# Patient Record
Sex: Female | Born: 1955 | Race: White | Hispanic: No | Marital: Married | Smoking: Never smoker
Health system: Southern US, Community
[De-identification: ages and names within clinical notes are randomized; demographics above are authoritative.]

## PROBLEM LIST (undated history)

## (undated) DIAGNOSIS — N951 Menopausal and female climacteric states: Secondary | ICD-10-CM

## (undated) DIAGNOSIS — M810 Age-related osteoporosis without current pathological fracture: Secondary | ICD-10-CM

## (undated) DIAGNOSIS — C439 Malignant melanoma of skin, unspecified: Secondary | ICD-10-CM

## (undated) DIAGNOSIS — C44612 Basal cell carcinoma of skin of right upper limb, including shoulder: Secondary | ICD-10-CM

## (undated) DIAGNOSIS — C801 Malignant (primary) neoplasm, unspecified: Secondary | ICD-10-CM

## (undated) DIAGNOSIS — G43909 Migraine, unspecified, not intractable, without status migrainosus: Secondary | ICD-10-CM

## (undated) DIAGNOSIS — J45909 Unspecified asthma, uncomplicated: Secondary | ICD-10-CM

## (undated) DIAGNOSIS — T7840XA Allergy, unspecified, initial encounter: Secondary | ICD-10-CM

## (undated) DIAGNOSIS — R112 Nausea with vomiting, unspecified: Secondary | ICD-10-CM

## (undated) DIAGNOSIS — D045 Carcinoma in situ of skin of trunk: Secondary | ICD-10-CM

## (undated) DIAGNOSIS — Z9889 Other specified postprocedural states: Secondary | ICD-10-CM

## (undated) DIAGNOSIS — F419 Anxiety disorder, unspecified: Secondary | ICD-10-CM

## (undated) DIAGNOSIS — K5790 Diverticulosis of intestine, part unspecified, without perforation or abscess without bleeding: Secondary | ICD-10-CM

## (undated) HISTORY — DX: Nausea with vomiting, unspecified: R11.2

## (undated) HISTORY — DX: Unspecified asthma, uncomplicated: J45.909

## (undated) HISTORY — DX: Malignant melanoma of skin, unspecified: C43.9

## (undated) HISTORY — DX: Basal cell carcinoma of skin of right upper limb, including shoulder: C44.612

## (undated) HISTORY — DX: Diverticulosis of intestine, part unspecified, without perforation or abscess without bleeding: K57.90

## (undated) HISTORY — PX: COSMETIC SURGERY: SHX468

## (undated) HISTORY — DX: Other specified postprocedural states: Z98.890

## (undated) HISTORY — DX: Age-related osteoporosis without current pathological fracture: M81.0

## (undated) HISTORY — DX: Anxiety disorder, unspecified: F41.9

## (undated) HISTORY — DX: Allergy, unspecified, initial encounter: T78.40XA

## (undated) HISTORY — DX: Carcinoma in situ of skin of trunk: D04.5

## (undated) HISTORY — DX: Malignant (primary) neoplasm, unspecified: C80.1

## (undated) HISTORY — PX: COLONOSCOPY: SHX174

## (undated) HISTORY — DX: Menopausal and female climacteric states: N95.1

## (undated) HISTORY — PX: OTHER SURGICAL HISTORY: SHX169

---

## 1997-11-12 ENCOUNTER — Other Ambulatory Visit: Admission: RE | Admit: 1997-11-12 | Discharge: 1997-11-12 | Payer: Self-pay | Admitting: Obstetrics and Gynecology

## 1998-11-18 ENCOUNTER — Other Ambulatory Visit: Admission: RE | Admit: 1998-11-18 | Discharge: 1998-11-18 | Payer: Self-pay | Admitting: Obstetrics and Gynecology

## 1999-11-19 ENCOUNTER — Other Ambulatory Visit: Admission: RE | Admit: 1999-11-19 | Discharge: 1999-11-19 | Payer: Self-pay | Admitting: *Deleted

## 2000-12-21 ENCOUNTER — Other Ambulatory Visit: Admission: RE | Admit: 2000-12-21 | Discharge: 2000-12-21 | Payer: Self-pay | Admitting: Obstetrics and Gynecology

## 2002-02-01 ENCOUNTER — Other Ambulatory Visit: Admission: RE | Admit: 2002-02-01 | Discharge: 2002-02-01 | Payer: Self-pay | Admitting: Obstetrics and Gynecology

## 2003-02-16 ENCOUNTER — Other Ambulatory Visit: Admission: RE | Admit: 2003-02-16 | Discharge: 2003-02-16 | Payer: Self-pay | Admitting: Obstetrics and Gynecology

## 2003-06-26 ENCOUNTER — Encounter: Admission: RE | Admit: 2003-06-26 | Discharge: 2003-06-26 | Payer: Self-pay | Admitting: Internal Medicine

## 2004-03-17 ENCOUNTER — Encounter (INDEPENDENT_AMBULATORY_CARE_PROVIDER_SITE_OTHER): Payer: Self-pay | Admitting: Specialist

## 2004-03-17 ENCOUNTER — Ambulatory Visit (HOSPITAL_COMMUNITY): Admission: RE | Admit: 2004-03-17 | Discharge: 2004-03-17 | Payer: Self-pay | Admitting: Obstetrics and Gynecology

## 2004-07-11 ENCOUNTER — Ambulatory Visit: Payer: Self-pay | Admitting: Internal Medicine

## 2004-07-30 ENCOUNTER — Ambulatory Visit: Payer: Self-pay | Admitting: Internal Medicine

## 2004-09-10 ENCOUNTER — Ambulatory Visit: Payer: Self-pay | Admitting: Gastroenterology

## 2004-09-24 ENCOUNTER — Ambulatory Visit: Payer: Self-pay | Admitting: Gastroenterology

## 2005-09-14 ENCOUNTER — Ambulatory Visit: Payer: Self-pay | Admitting: Internal Medicine

## 2005-09-17 ENCOUNTER — Ambulatory Visit: Payer: Self-pay | Admitting: Internal Medicine

## 2005-10-25 IMAGING — US US RETROPERITONEAL COMPLETE
1 series · 14 of 25 positions shown · non-contrast
Comparison: none

CLINICAL DATA: Right flank pain.  Hematuria.
 RENAL ULTRASOUND

[Series 1: unknown · 0.23mm/px · 14 of 25 slices shown]
[im 1/25]
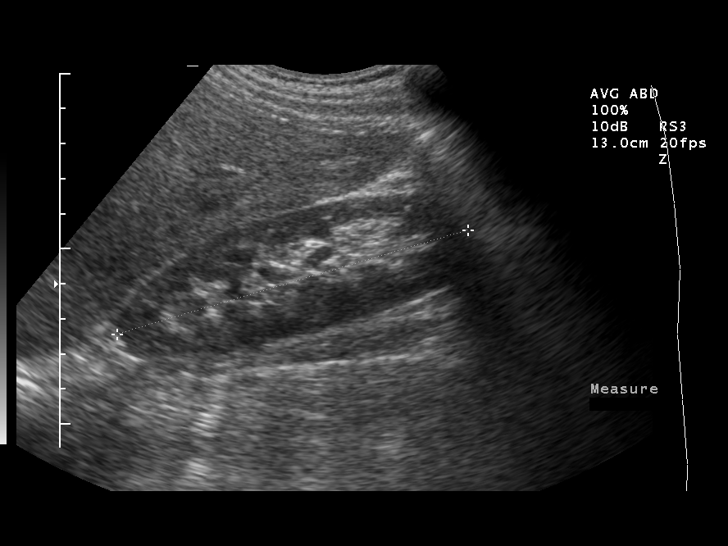
[im 3/25]
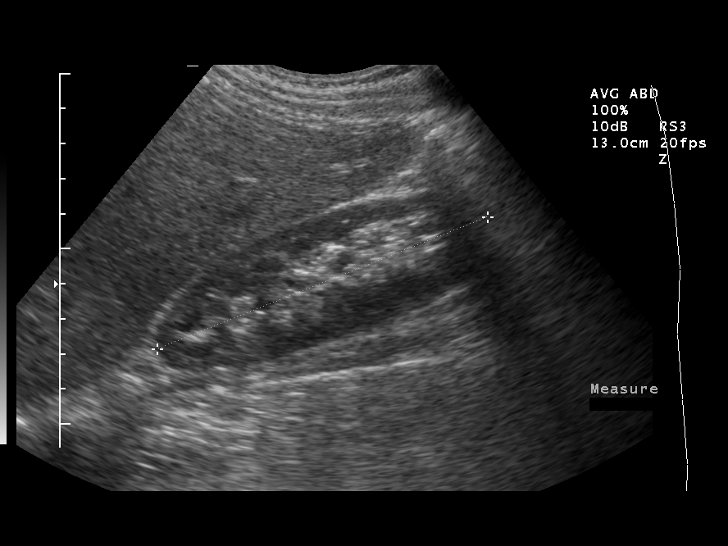
[im 5/25]
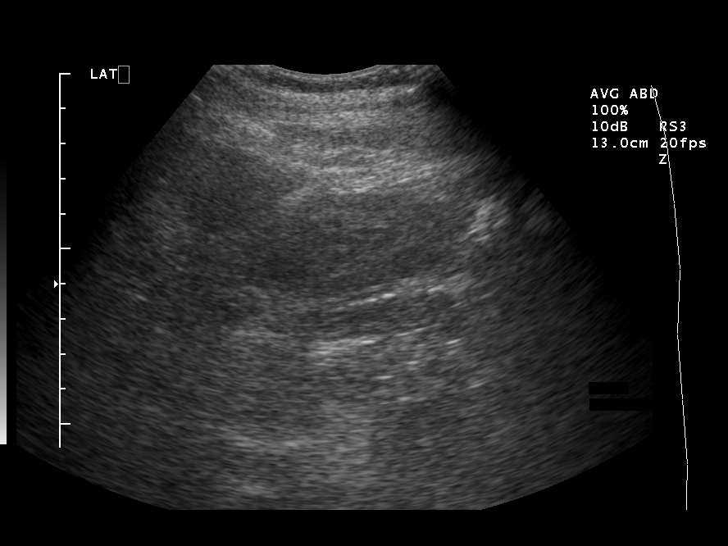
[im 7/25]
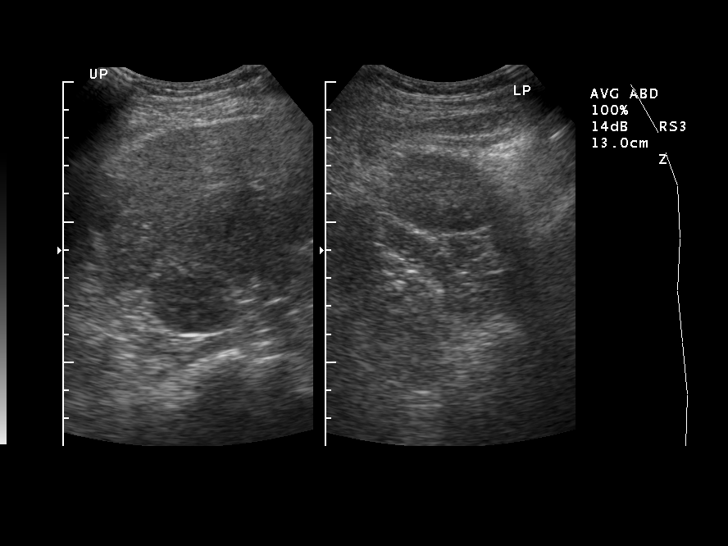
[im 9/25]
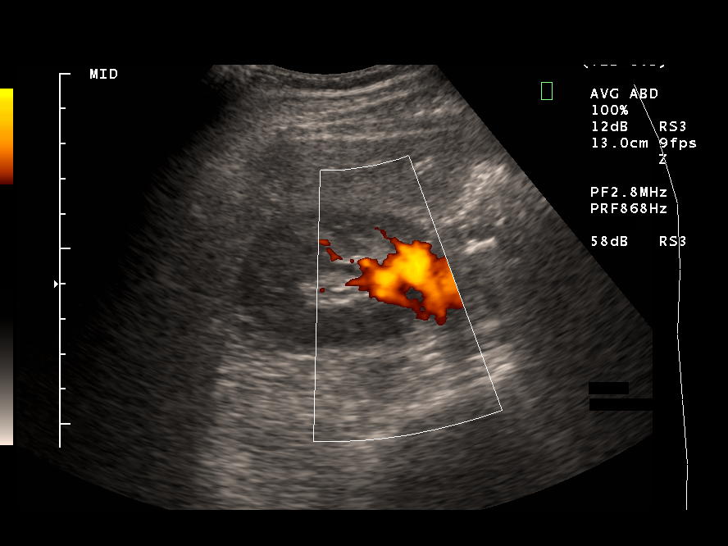
[im 10/25]
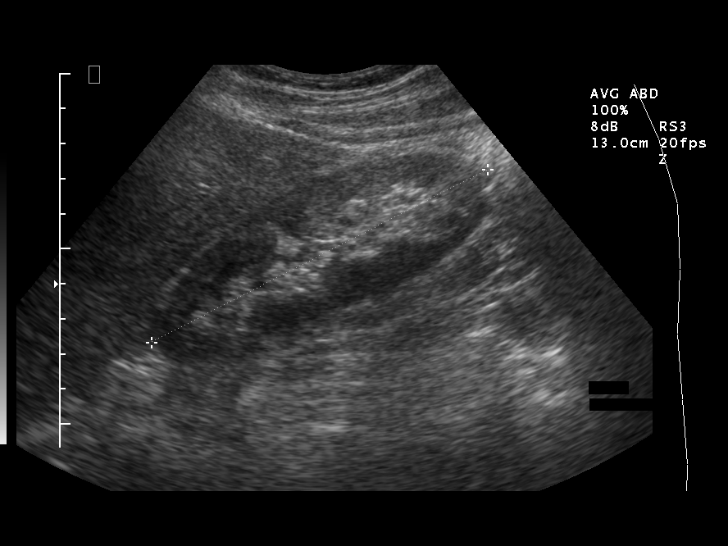
[im 12/25]
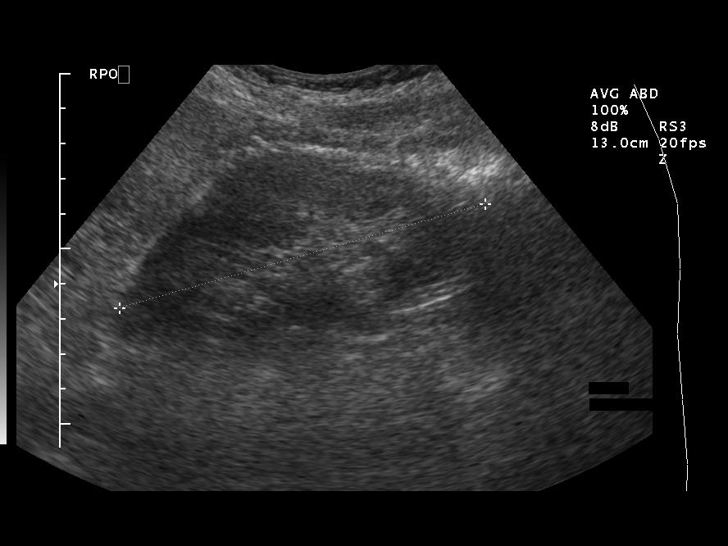
[im 14/25]
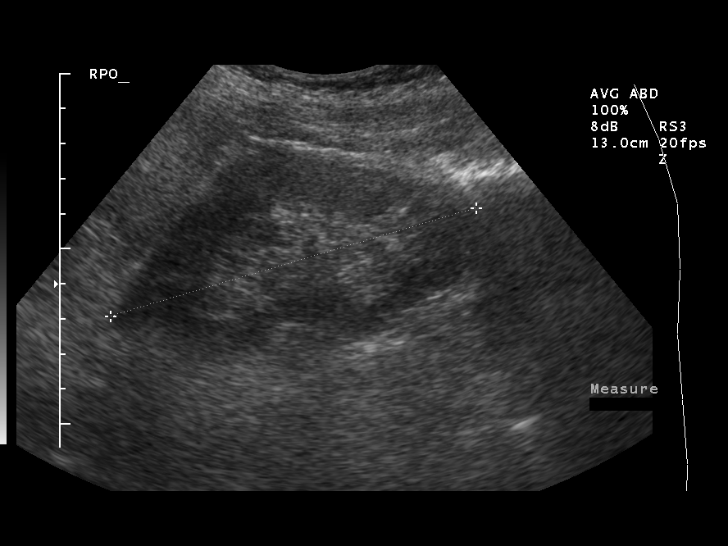
[im 16/25]
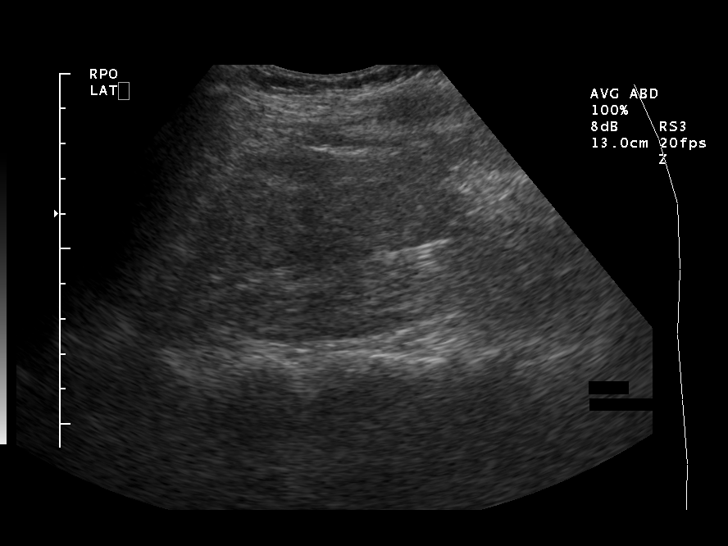
[im 17/25]
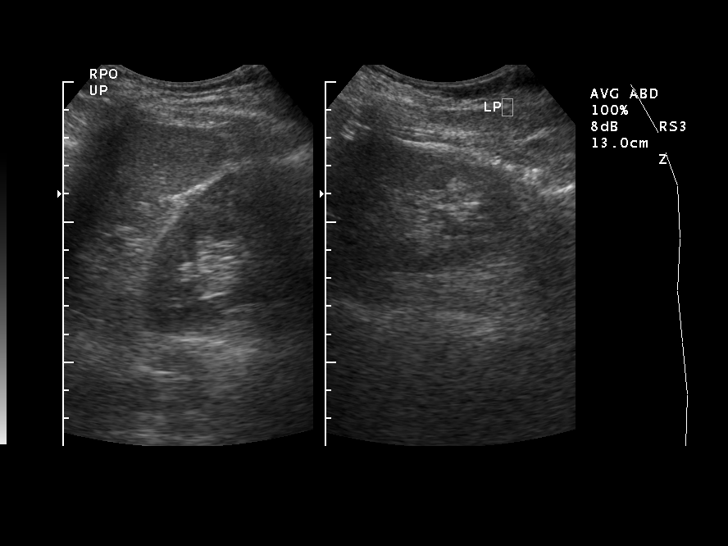
[im 19/25]
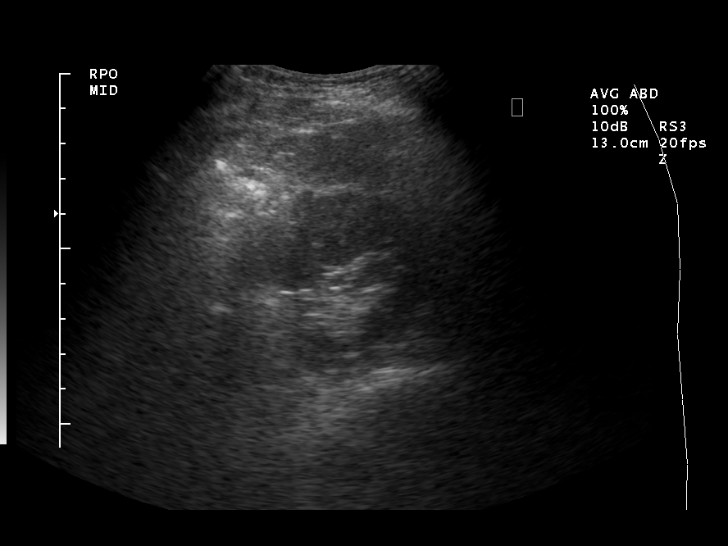
[im 21/25]
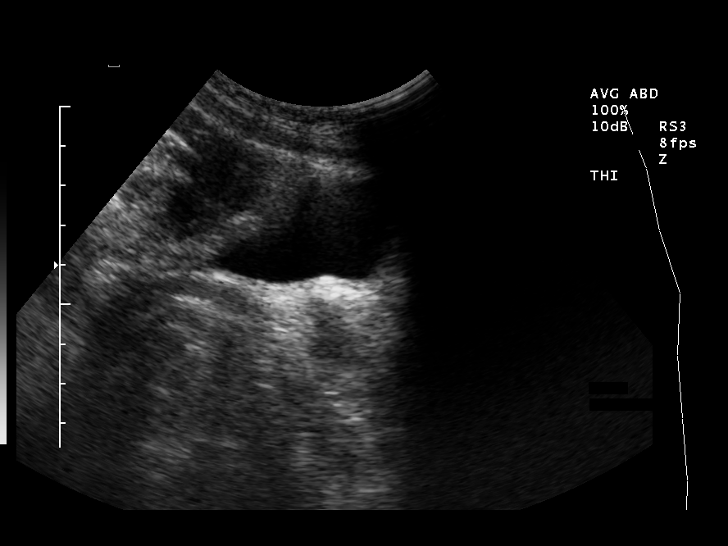
[im 23/25]
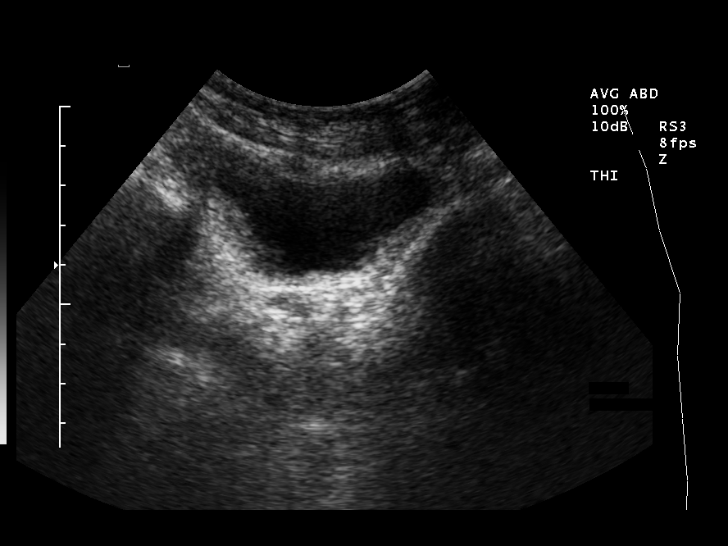
[im 25/25]
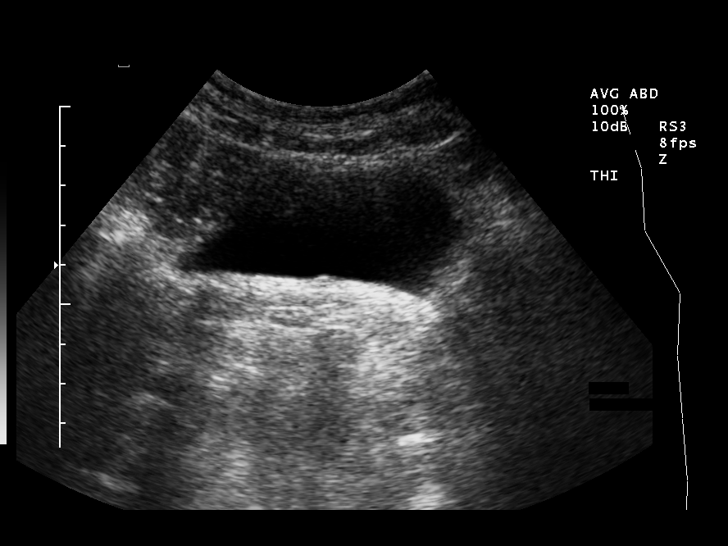

[14 of 25 positions shown; findings below may reference images not displayed]

FINDINGS: Both kidneys are within normal limits in size and parenchymal echogenicity. There is no evidence of renal parenchymal lesions.  The right kidney measures 10.8 cm long with the left kidney measuring 10.9 cm long with no hydronephrosis.

 The urinary bladder is unremarkable in appearance for the degree of bladder filling. 

 IMPRESSION
 Normal study.

## 2007-04-20 LAB — CONVERTED CEMR LAB: Pap Smear: NORMAL

## 2007-04-26 ENCOUNTER — Encounter: Payer: Self-pay | Admitting: Internal Medicine

## 2007-05-04 ENCOUNTER — Encounter: Payer: Self-pay | Admitting: Internal Medicine

## 2007-06-29 ENCOUNTER — Ambulatory Visit: Payer: Self-pay | Admitting: Internal Medicine

## 2007-06-29 LAB — CONVERTED CEMR LAB
ALT: 21 units/L (ref 0–35)
AST: 25 units/L (ref 0–37)
Basophils Relative: 0.3 % (ref 0.0–1.0)
Bilirubin, Direct: 0.1 mg/dL (ref 0.0–0.3)
CO2: 29 meq/L (ref 19–32)
Calcium: 9.5 mg/dL (ref 8.4–10.5)
Eosinophils Relative: 3 % (ref 0.0–5.0)
GFR calc Af Amer: 84 mL/min
Glucose, Bld: 92 mg/dL (ref 70–99)
HCT: 40.4 % (ref 36.0–46.0)
Hemoglobin: 14 g/dL (ref 12.0–15.0)
Leukocytes, UA: NEGATIVE
Lymphocytes Relative: 40.6 % (ref 12.0–46.0)
Neutro Abs: 1.9 10*3/uL (ref 1.4–7.7)
Neutrophils Relative %: 45.5 % (ref 43.0–77.0)
Nitrite: NEGATIVE
Platelets: 189 10*3/uL (ref 150–400)
Sodium: 141 meq/L (ref 135–145)
Specific Gravity, Urine: 1.015 (ref 1.000–1.03)
Total Protein, Urine: NEGATIVE mg/dL
Total Protein: 6.8 g/dL (ref 6.0–8.3)
Urobilinogen, UA: 0.2 (ref 0.0–1.0)
WBC: 4.1 10*3/uL — ABNORMAL LOW (ref 4.5–10.5)

## 2007-07-01 ENCOUNTER — Encounter: Payer: Self-pay | Admitting: *Deleted

## 2007-07-01 DIAGNOSIS — J45909 Unspecified asthma, uncomplicated: Secondary | ICD-10-CM | POA: Insufficient documentation

## 2007-07-01 DIAGNOSIS — G43909 Migraine, unspecified, not intractable, without status migrainosus: Secondary | ICD-10-CM | POA: Insufficient documentation

## 2007-07-01 DIAGNOSIS — H15009 Unspecified scleritis, unspecified eye: Secondary | ICD-10-CM | POA: Insufficient documentation

## 2007-07-01 DIAGNOSIS — M810 Age-related osteoporosis without current pathological fracture: Secondary | ICD-10-CM | POA: Insufficient documentation

## 2007-07-01 DIAGNOSIS — J189 Pneumonia, unspecified organism: Secondary | ICD-10-CM | POA: Insufficient documentation

## 2007-07-01 DIAGNOSIS — B351 Tinea unguium: Secondary | ICD-10-CM | POA: Insufficient documentation

## 2007-07-01 DIAGNOSIS — M722 Plantar fascial fibromatosis: Secondary | ICD-10-CM | POA: Insufficient documentation

## 2007-07-04 ENCOUNTER — Ambulatory Visit: Payer: Self-pay | Admitting: Internal Medicine

## 2008-01-31 ENCOUNTER — Encounter: Payer: Self-pay | Admitting: Internal Medicine

## 2008-02-03 ENCOUNTER — Telehealth: Payer: Self-pay | Admitting: Internal Medicine

## 2008-11-29 ENCOUNTER — Encounter: Payer: Self-pay | Admitting: Internal Medicine

## 2009-04-29 ENCOUNTER — Encounter: Payer: Self-pay | Admitting: Internal Medicine

## 2009-12-25 ENCOUNTER — Telehealth: Payer: Self-pay | Admitting: Internal Medicine

## 2010-07-08 NOTE — Progress Notes (Signed)
    Preventive Care Screening  Mammogram:    Date:  12/04/2009    Results:  normal bilateral

## 2010-10-24 NOTE — Op Note (Signed)
NAME:  Laurie Johns, Laurie Johns               ACCOUNT NO.:  [id number]   MEDICAL RECORD NO.:  [id number]          PATIENT TYPE:  AMB   LOCATION:  [facility]                           FACILITY:  [facility]   PHYSICIAN:  Lenoard Aden, M.D.DATE OF BIRTH:  1955/07/24   DATE OF PROCEDURE:  03/17/2004  DATE OF DISCHARGE:                                 OPERATIVE REPORT   PREOPERATIVE DIAGNOSES:  1.  Postmenopausal bleeding.  2.  Endometrial polyp.   POSTOPERATIVE DIAGNOSES:  1.  Postmenopausal bleeding.  2.  Endometrial polyp.   PROCEDURE:  Diagnostic hysteroscopy, resectoscopic polypectomy, dilatation  and curettage.   SURGEON:  Lenoard Aden, M.D.   ANESTHESIA:  MAC, paracervical.   ESTIMATED BLOOD LOSS:  About 50 mL.   COMPLICATIONS:  None.   FLUID DEFICIT:  50 mL.   The patient went to recovery in good condition.   SPECIMENS TO PATHOLOGY:  Endometrial curettings to pathology.   BRIEF OPERATIVE NOTE:  After being apprised of the risks of anesthesia,  infection, bleeding, uterine perforation with possible need for repair, the  patient was brought to the operating room, where she was administered IV  sedation without difficulty, prepped and draped in the usual sterile  fashion, catheterized until the bladder is empty.  After achieving adequate  anesthesia, dilute Nesacaine solution is placed in a standard paracervical  block.  Approximately 30 mL of a 1% Nesacaine solution and dilute Pitressin  solution were placed at 3 and 9 o'clock at the cervicovaginal junction, 16  mL total.  The uterus sounds to 10 cm, is easily dilated up to a #31 Pratt  dilator.  The hysteroscope placed.  Visualization reveals sessile-appearing  anterior wall endometrial polyp, which is resected using a double right-  angle loop without difficulty.  Bilateral normal tubal ostia are noted.  Thickening along the anterior and posterior walls is resected using the  right angle loop without difficulty.  Good hemostasis  is achieved.  D&C is performed.  Good hemostasis noted.  Visualization reveals a normal-  appearing cavity, normal-appearing endocervical canal.  All instruments are  removed.  The patient tolerates the procedure well, is transferred to  recovery in good condition.      RJT/MEDQ  D:  03/17/2004  T:  03/17/2004  Job:  [id number]

## 2010-10-24 NOTE — H&P (Signed)
NAME:  Laurie Johns, Laurie Johns               ACCOUNT NO.:  [id number]   MEDICAL RECORD NO.:  [id number]          PATIENT TYPE:  AMB   LOCATION:  [facility]                           FACILITY:  [facility]   PHYSICIAN:  Lenoard Aden, M.D.DATE OF BIRTH:  [birth year]   DATE OF ADMISSION:  03/17/2004  DATE OF DISCHARGE:                                HISTORY & PHYSICAL   CHIEF COMPLAINT:  Postmenopausal bleeding.   HISTORY OF PRESENT ILLNESS:  The patient is a 55 year old white female G2 P2  with known uterine fibroids and postmenopausal bleeding who presents for  saline sonohysterography.  She had an ultrasound which reveals multiple  uterine fibroids with one questionable submucosal one as noted on saline  sonohysterography on March 07, 2004.   MEDICATIONS:  Include glucosamine and progesterone.   PAST MEDICAL HISTORY:  Remarkable for no medical or surgical  hospitalizations outside of pregnancy.  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  She has a family history for a mother with a history of  cerebrovascular accident.   PHYSICAL EXAMINATION:  GENERAL:  She is a well-developed, well-nourished  white female in no acute distress.  HEENT:  Normal.  LUNGS:  Clear.  HEART:  Regular rhythm.  ABDOMEN:  Soft, nontender.  PELVIC:  Reveals a bulky, anteflexed uterus, and no adnexal masses.   IMPRESSION:  Postmenopausal bleeding with questionable structural lesion.   PLAN:  Proceed with diagnostic hysteroscopy, D&C, resectoscope.  Risks of  anesthesia, infection, bleeding, injury to abdominal organs with need for  repair is discussed.  Delayed versus immediate complications to include  bowel and bladder injury with possible uterine perforation noted.  The  patient acknowledges and wishes to proceed.      RJT/MEDQ  D:  03/16/2004  T:  03/17/2004  Job:  [id number]

## 2010-11-19 ENCOUNTER — Other Ambulatory Visit: Payer: Self-pay | Admitting: *Deleted

## 2010-11-19 ENCOUNTER — Telehealth: Payer: Self-pay | Admitting: *Deleted

## 2010-11-19 MED ORDER — EPINEPHRINE 0.3 MG/0.3ML IJ DEVI: 0.3000 mg | Freq: Once | INTRAMUSCULAR | Status: DC

## 2010-11-19 MED ORDER — SUMATRIPTAN SUCCINATE 50 MG PO TABS: 50.0000 mg | ORAL_TABLET | Freq: Once | ORAL | Status: DC | PRN

## 2010-11-19 NOTE — Telephone Encounter (Signed)
Patient requesting RX for epipen 2 pak and imitrex. (She has allergy to bee's). Also needs this today b/c insurance ends Friday.

## 2010-11-19 NOTE — Telephone Encounter (Signed)
Done,  Patient informed

## 2010-11-19 NOTE — Telephone Encounter (Signed)
Ok for refill on epi-pen and imitrex

## 2011-03-03 ENCOUNTER — Encounter: Payer: Self-pay | Admitting: Internal Medicine

## 2011-10-07 DIAGNOSIS — C801 Malignant (primary) neoplasm, unspecified: Secondary | ICD-10-CM

## 2011-10-07 HISTORY — DX: Malignant (primary) neoplasm, unspecified: C80.1

## 2011-10-08 ENCOUNTER — Other Ambulatory Visit: Payer: Self-pay | Admitting: Dermatology

## 2012-08-08 ENCOUNTER — Ambulatory Visit (INDEPENDENT_AMBULATORY_CARE_PROVIDER_SITE_OTHER): Payer: BC Managed Care – PPO | Admitting: Internal Medicine

## 2012-08-08 ENCOUNTER — Other Ambulatory Visit (INDEPENDENT_AMBULATORY_CARE_PROVIDER_SITE_OTHER): Payer: BC Managed Care – PPO

## 2012-08-08 ENCOUNTER — Encounter: Payer: Self-pay | Admitting: Internal Medicine

## 2012-08-08 VITALS — BP 118/72 | HR 82 | Temp 98.0°F | Resp 10 | Ht 62.6 in | Wt 128.0 lb

## 2012-08-08 DIAGNOSIS — G43909 Migraine, unspecified, not intractable, without status migrainosus: Secondary | ICD-10-CM

## 2012-08-08 DIAGNOSIS — Z23 Encounter for immunization: Secondary | ICD-10-CM

## 2012-08-08 DIAGNOSIS — M810 Age-related osteoporosis without current pathological fracture: Secondary | ICD-10-CM

## 2012-08-08 DIAGNOSIS — Z0000 Encounter for general adult medical examination without abnormal findings: Secondary | ICD-10-CM

## 2012-08-08 LAB — COMPREHENSIVE METABOLIC PANEL
ALT: 20 U/L (ref 0–35)
AST: 23 U/L (ref 0–37)
Albumin: 4.1 g/dL (ref 3.5–5.2)
Calcium: 9.4 mg/dL (ref 8.4–10.5)
Chloride: 105 mEq/L (ref 96–112)
Potassium: 4 mEq/L (ref 3.5–5.1)

## 2012-08-08 LAB — LIPID PANEL
Total CHOL/HDL Ratio: 3
VLDL: 15.8 mg/dL (ref 0.0–40.0)

## 2012-08-08 NOTE — Patient Instructions (Addendum)
Thanks for coming to see me.  You will receive a full report and I encourage you to sign up for My Chart.
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
## 2012-08-09 ENCOUNTER — Other Ambulatory Visit: Payer: Self-pay | Admitting: *Deleted

## 2012-08-09 ENCOUNTER — Other Ambulatory Visit: Payer: Self-pay

## 2012-08-09 DIAGNOSIS — Z0000 Encounter for general adult medical examination without abnormal findings: Secondary | ICD-10-CM | POA: Insufficient documentation

## 2012-08-09 MED ORDER — SUMATRIPTAN SUCCINATE 50 MG PO TABS: 50.0000 mg | ORAL_TABLET | Freq: Once | ORAL | Status: DC | PRN

## 2012-08-09 MED ORDER — EPINEPHRINE 0.3 MG/0.3ML IJ DEVI: 0.3000 mg | Freq: Once | INTRAMUSCULAR | Status: DC
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
## 2012-08-09 NOTE — Assessment & Plan Note (Signed)
Stable - still has occasional HA but responds to Imitrex

## 2012-08-09 NOTE — Assessment & Plan Note (Signed)
Continues on treatment. She had been advised that the magnesium in well water may have interfered with calcium absorption. She has been on bottled water for a year and is due for follow up DEXA in '15

## 2012-08-17 ENCOUNTER — Telehealth: Payer: Self-pay | Admitting: *Deleted

## 2012-08-17 NOTE — Telephone Encounter (Signed)
PA came through on 08/09/12 from pharmacy for pt's rx refill for Sumatripton, Qty #30. Called pt's ins plan [phone]) to initiate the PA, was told by the representative, Tasia Catchings that the pt received Qty#9 on 08/09/12. They declined the pt to receive Qty#30 prior to the 30 days being up. I asked if pt needed a refill when the 30 days is up, representative stated that the pt can receive a refill. Called pharmacy to confirm pt received the Qty#9 on 08/09/12, they confirmed that pt did receive them. Advised pharmacy tech that the pt will be able to refill after 30 days per [employer], [place].

## 2013-03-22 ENCOUNTER — Encounter: Payer: Self-pay | Admitting: Internal Medicine

## 2013-04-13 ENCOUNTER — Other Ambulatory Visit: Payer: Self-pay

## 2013-10-12 ENCOUNTER — Other Ambulatory Visit: Payer: Self-pay | Admitting: Dermatology

## 2013-10-31 ENCOUNTER — Encounter: Payer: Self-pay | Admitting: Internal Medicine

## 2014-03-07 ENCOUNTER — Encounter: Payer: Self-pay | Admitting: Family

## 2014-03-07 ENCOUNTER — Ambulatory Visit (INDEPENDENT_AMBULATORY_CARE_PROVIDER_SITE_OTHER): Payer: BC Managed Care – PPO | Admitting: Family

## 2014-03-07 VITALS — BP 129/65 | HR 79 | Temp 98.2°F | Resp 16 | Wt 127.2 lb

## 2014-03-07 DIAGNOSIS — Z23 Encounter for immunization: Secondary | ICD-10-CM

## 2014-03-07 DIAGNOSIS — N3000 Acute cystitis without hematuria: Secondary | ICD-10-CM

## 2014-03-07 DIAGNOSIS — R300 Dysuria: Secondary | ICD-10-CM

## 2014-03-07 LAB — POCT URINALYSIS DIPSTICK
Bilirubin, UA: NEGATIVE
Blood, UA: NEGATIVE
GLUCOSE UA: NEGATIVE
Ketones, UA: NEGATIVE
NITRITE UA: NEGATIVE
PROTEIN UA: NEGATIVE
UROBILINOGEN UA: 0.2
pH, UA: 7.5

## 2014-03-07 MED ORDER — CIPROFLOXACIN HCL 250 MG PO TABS: 250.0000 mg | ORAL_TABLET | Freq: Two times a day (BID) | ORAL | Status: DC

## 2014-03-07 NOTE — Progress Notes (Signed)
Subjective:    Patient ID: Laurie Johns, female    DOB: 06/08/1956, 58 y.o.   MRN: [id number]  HPI  [name] is a 58 yr old female who presents today to discuss urinary urgency. She has followed with Dr. Linda Hedges previously at our [facility] location who has since retired. Her last visit with Dr. Linda Hedges was March 2014. Symptoms started Monday AM 9/28.   Developed associated some right sided abdominal pain, right low back pain, frequency, urgency. Took two doses of amoxicillin that she had on hand. No significant improvement.    Review of Systems    see HPI  Past Medical History  Diagnosis Date  . Cancer 10/2011    basal cell carcinoma, chest  . Osteoporosis     History   Social History  . Marital Status: Married    Spouse Name: N/A    Number of Children: 2  . Years of Education: 16   Occupational History  . photographer    Social History Main Topics  . Smoking status: Never Smoker   . Smokeless tobacco: Never Used  . Alcohol Use: 3.5 oz/week    7 drink(s) per week  . Drug Use: No  . Sexual Activity: Yes    Partners: Male   Other Topics Concern  . Not on file   Social History Narrative   HSG, [place].Married '80 Children: 2 sons; '84 and '87; 1 granddaughter, expecting one. 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 Regular exercise: occasionally, more during warmer weather. Marriage is doing well.    Caffeine use: daily, coffee    Past Surgical History  Procedure Laterality Date  . Myomectomy    . G2p2      Family History  Problem Relation Age of Onset  . Dementia Mother   . Hypertension Mother   . Hypothyroidism Mother   . Hyperlipidemia Mother   . Osteoporosis Mother   . Osteoporosis Father   . Hypertension Father   . Osteoporosis Sister   . Hypothyroidism Sister   . Heart disease Brother   . Hypertension Brother   . Diabetes Maternal Grandmother   . COPD Neg Hx     Allergies  Allergen Reactions  . Codeine     REACTION:  causes gi upset    Current Outpatient Prescriptions on File Prior to Visit  Medication Sig Dispense Refill  . EPINEPHrine (EPIPEN 2-PAK) 0.3 mg/0.3 mL DEVI Inject 0.3 mLs (0.3 mg total) into the muscle once.  2 Device  2  . SUMAtriptan (IMITREX) 50 MG tablet Take 1 tablet (50 mg total) by mouth once as needed for migraine.  30 tablet  2   No current facility-administered medications on file prior to visit.    BP 129/65  Pulse 79  Temp(Src) 98.2 F (36.8 C) (Oral)  Resp 16  Wt 127 lb 3.2 oz (57.698 kg)  SpO2 99%    Objective:   Physical Exam  Constitutional: She is oriented to person, place, and time. She appears well-developed and well-nourished. No distress.  HENT:  Head: Normocephalic and atraumatic.  Cardiovascular: Normal rate and regular rhythm.   No murmur heard. Pulmonary/Chest: Effort normal and breath sounds normal. No respiratory distress. She has no wheezes. She has no rales. She exhibits no tenderness.  Abdominal: Soft. Bowel sounds are normal. She exhibits no distension and no mass. There is no tenderness. There is no rebound, no guarding and no CVA tenderness.  Musculoskeletal: She exhibits no edema.  Neurological:  She is alert and oriented to person, place, and time.  Skin: Skin is warm and dry.  Psychiatric: She has a normal mood and affect. Her behavior is normal. Judgment and thought content normal.          Assessment & Plan:

## 2014-03-07 NOTE — Assessment & Plan Note (Signed)
UA is performed and notes + leuks.  Will rx with cipro. Pt is instructed to call if symptoms worsen or if symptoms are not improved in 2-3 days.

## 2014-03-07 NOTE — Patient Instructions (Signed)

## 2014-03-07 NOTE — Addendum Note (Signed)
Addended by: Kelle Darting A on: 03/07/2014 11:57 AM   Modules accepted: Orders

## 2014-03-08 LAB — URINE CULTURE
COLONY COUNT: NO GROWTH
ORGANISM ID, BACTERIA: NO GROWTH

## 2014-06-06 ENCOUNTER — Encounter (HOSPITAL_BASED_OUTPATIENT_CLINIC_OR_DEPARTMENT_OTHER): Payer: Self-pay | Admitting: *Deleted

## 2014-06-06 ENCOUNTER — Emergency Department (HOSPITAL_BASED_OUTPATIENT_CLINIC_OR_DEPARTMENT_OTHER): Payer: BC Managed Care – PPO

## 2014-06-06 ENCOUNTER — Emergency Department (HOSPITAL_BASED_OUTPATIENT_CLINIC_OR_DEPARTMENT_OTHER)
Admission: EM | Admit: 2014-06-06 | Discharge: 2014-06-06 | Disposition: A | Discharge status: Home / Self Care | Payer: BC Managed Care – PPO | Attending: Emergency Medicine | Admitting: Emergency Medicine

## 2014-06-06 DIAGNOSIS — Z79899 Other long term (current) drug therapy: Secondary | ICD-10-CM | POA: Diagnosis not present

## 2014-06-06 DIAGNOSIS — Z85828 Personal history of other malignant neoplasm of skin: Secondary | ICD-10-CM | POA: Insufficient documentation

## 2014-06-06 DIAGNOSIS — R05 Cough: Secondary | ICD-10-CM | POA: Diagnosis present

## 2014-06-06 DIAGNOSIS — R059 Cough, unspecified: Secondary | ICD-10-CM

## 2014-06-06 DIAGNOSIS — M810 Age-related osteoporosis without current pathological fracture: Secondary | ICD-10-CM | POA: Insufficient documentation

## 2014-06-06 DIAGNOSIS — J069 Acute upper respiratory infection, unspecified: Secondary | ICD-10-CM | POA: Diagnosis not present

## 2014-06-06 HISTORY — DX: Migraine, unspecified, not intractable, without status migrainosus: G43.909

## 2014-06-06 MED ORDER — AZITHROMYCIN 250 MG PO TABS: 250.0000 mg | ORAL_TABLET | Freq: Every day | ORAL | Status: DC

## 2014-06-06 MED ORDER — ALBUTEROL SULFATE HFA 108 (90 BASE) MCG/ACT IN AERS: 1.0000 | INHALATION_SPRAY | Freq: Four times a day (QID) | RESPIRATORY_TRACT | Status: DC | PRN

## 2014-06-06 MED ORDER — HYDROCOD POLST-CHLORPHEN POLST 10-8 MG/5ML PO LQCR: 5.0000 mL | Freq: Two times a day (BID) | ORAL | Status: DC | PRN

## 2014-06-06 NOTE — ED Provider Notes (Signed)
TIME SEEN: 10:00 AM  CHIEF COMPLAINT: Cough  HPI: Patient is a 58 y.o. F with history of basal cell carcinoma in 2013 status post resection, prior history of asthma who presents the emergency department with 2 months of intermittent dry hacking cough. She suspected that this was seasonal allergies. She states for the past week however she has had subjective fevers, productive cough with yellow-green sputum and feeling like she has a fullness in her chest. No nausea, vomiting or diarrhea. States that she felt worse but to guaifenesin earlier today and states this has helped. She has not noticed any wheezing and denies shortness of breath. No recent travel. States her grandson was recently diagnosed with RSV. She has been caring for her grandson recently.  ROS: See HPI Constitutional: Subjective fever  Eyes: no drainage  ENT: no runny nose   Cardiovascular:  no chest pain  Resp: no SOB  GI: no vomiting GU: no dysuria Integumentary: no rash  Allergy: no hives  Musculoskeletal: no leg swelling  Neurological: no slurred speech ROS otherwise negative  PAST MEDICAL HISTORY/PAST SURGICAL HISTORY:  Past Medical History  Diagnosis Date  . Cancer 10/2011    basal cell carcinoma, chest  . Osteoporosis     MEDICATIONS:  Prior to Admission medications   Medication Sig Start Date End Date Taking? Authorizing Provider  Calcium Carb-Cholecalciferol (CALCIUM 600 + D PO) Take 2 tablets by mouth daily.    Historical Provider, MD  ciprofloxacin (CIPRO) 250 MG tablet Take 1 tablet (250 mg total) by mouth 2 (two) times daily. 03/07/14   Debbrah Alar, NP  EPINEPHrine (EPIPEN 2-PAK) 0.3 mg/0.3 mL DEVI Inject 0.3 mLs (0.3 mg total) into the muscle once. 08/09/12   Neena Rhymes, MD  FIBER PO Take 2 tablets by mouth daily.    Historical Provider, MD  glucosamine-chondroitin 500-400 MG tablet Take 1 tablet by mouth daily.    Historical Provider, MD  Loratadine (KS ALLERCLEAR PO) Take 1 tablet by mouth  daily.    Historical Provider, MD  Multiple Vitamins-Minerals (CENTRUM SILVER PO) Take 1 tablet by mouth daily.    Historical Provider, MD  Omega-3 Fatty Acids (FISH OIL) 1000 MG CAPS Take 2 capsules by mouth daily.    Historical Provider, MD  SUMAtriptan (IMITREX) 50 MG tablet Take 1 tablet (50 mg total) by mouth once as needed for migraine. 08/09/12   Neena Rhymes, MD  Zoledronic Acid (RECLAST IV) Inject into the vein. Once a year    Historical Provider, MD    ALLERGIES:  Allergies  Allergen Reactions  . Codeine     REACTION: causes gi upset    SOCIAL HISTORY:  History  Substance Use Topics  . Smoking status: Never Smoker   . Smokeless tobacco: Never Used  . Alcohol Use: 3.5 oz/week    7 drink(s) per week    FAMILY HISTORY: Family History  Problem Relation Age of Onset  . Dementia Mother   . Hypertension Mother   . Hypothyroidism Mother   . Hyperlipidemia Mother   . Osteoporosis Mother   . Osteoporosis Father   . Hypertension Father   . Osteoporosis Sister   . Hypothyroidism Sister   . Heart disease Brother   . Hypertension Brother   . Diabetes Maternal Grandmother   . COPD Neg Hx     EXAM: BP 141/77 mmHg  Pulse 74  Temp(Src) 98.1 F (36.7 C) (Oral)  Resp 18  Ht 5\' 2"  (1.575 m)  Wt 129 lb (  58.514 kg)  BMI 23.59 kg/m2  SpO2 100% CONSTITUTIONAL: Alert and oriented and responds appropriately to questions. Well-appearing; well-nourished HEAD: Normocephalic EYES: Conjunctivae clear, PERRL ENT: normal nose; no rhinorrhea; moist mucous membranes; pharynx without lesions noted; patient is a slightly hoarse voice, mild amount of sinus drainage noted in the posterior oropharynx is clear, no tonsillar hypertrophy or exudate, no trismus or drooling, no uvular deviation, no muffled voice NECK: Supple, no meningismus, no LAD  CARD: RRR; S1 and S2 appreciated; no murmurs, no clicks, no rubs, no gallops RESP: Normal chest excursion without splinting or tachypnea; breath  sounds clear and equal bilaterally; no wheezes, no rhonchi, no rales, no hypoxia or respiratory distress, speaking full sentences ABD/GI: Normal bowel sounds; non-distended; soft, non-tender, no rebound, no guarding BACK:  The back appears normal and is non-tender to palpation, there is no CVA tenderness EXT: Normal ROM in all joints; non-tender to palpation; no edema; normal capillary refill; no cyanosis    SKIN: Normal color for age and race; warm NEURO: Moves all extremities equally PSYCH: The patient's mood and manner are appropriate. Grooming and personal hygiene are appropriate.  MEDICAL DECISION MAKING: Patient here with what appears to be a viral URI. Have discussed with her that antibiotics will not help with this but she is concerned given her prior history of asthma that this may develop into pneumonia which has happened to her in the past. Her chest x-ray is clear. She has no respiratory distress or hypoxia. We'll discharge with prescription for azithromycin to use if symptoms do not improve in the next several days. Have advised her to use over-the-counter guaifenesin and will also discharge with perception for Tussionex and albuterol inhaler. Have discussed strict return precautions. She verbalized understanding and is comfortable with this plan.         [place], DO 06/06/14 1054

## 2014-06-06 NOTE — ED Notes (Signed)
MD at bedside. 

## 2014-06-06 NOTE — Discharge Instructions (Signed)
Upper Respiratory Infection, Adult An upper respiratory infection (URI) is also sometimes known as the common cold. The upper respiratory tract includes the nose, sinuses, throat, trachea, and bronchi. Bronchi are the airways leading to the lungs. Most people improve within 1 week, but symptoms can last up to 2 weeks. A residual cough may last even longer.  CAUSES Many different viruses can infect the tissues lining the upper respiratory tract. The tissues become irritated and inflamed and often become very moist. Mucus production is also common. A cold is contagious. You can easily spread the virus to others by oral contact. This includes kissing, sharing a glass, coughing, or sneezing. Touching your mouth or nose and then touching a surface, which is then touched by another person, can also spread the virus. SYMPTOMS  Symptoms typically develop 1 to 3 days after you come in contact with a cold virus. Symptoms vary from person to person. They may include:  Runny nose.  Sneezing.  Nasal congestion.  Sinus irritation.  Sore throat.  Loss of voice (laryngitis).  Cough.  Fatigue.  Muscle aches.  Loss of appetite.  Headache.  Low-grade fever. DIAGNOSIS  You might diagnose your own cold based on familiar symptoms, since most people get a cold 2 to 3 times a year. Your caregiver can confirm this based on your exam. Most importantly, your caregiver can check that your symptoms are not due to another disease such as strep throat, sinusitis, pneumonia, asthma, or epiglottitis. Blood tests, throat tests, and X-rays are not necessary to diagnose a common cold, but they may sometimes be helpful in excluding other more serious diseases. Your caregiver will decide if any further tests are required. RISKS AND COMPLICATIONS  You may be at risk for a more severe case of the common cold if you smoke cigarettes, have chronic heart disease (such as heart failure) or lung disease (such as asthma), or if  you have a weakened immune system. The very young and very old are also at risk for more serious infections. Bacterial sinusitis, middle ear infections, and bacterial pneumonia can complicate the common cold. The common cold can worsen asthma and chronic obstructive pulmonary disease (COPD). Sometimes, these complications can require emergency medical care and may be life-threatening. PREVENTION  The best way to protect against getting a cold is to practice good hygiene. Avoid oral or hand contact with people with cold symptoms. Wash your hands often if contact occurs. There is no clear evidence that vitamin C, vitamin E, echinacea, or exercise reduces the chance of developing a cold. However, it is always recommended to get plenty of rest and practice good nutrition. TREATMENT  Treatment is directed at relieving symptoms. There is no cure. Antibiotics are not effective, because the infection is caused by a virus, not by bacteria. Treatment may include:  Increased fluid intake. Sports drinks offer valuable electrolytes, sugars, and fluids.  Breathing heated mist or steam (vaporizer or shower).  Eating chicken soup or other clear broths, and maintaining good nutrition.  Getting plenty of rest.  Using gargles or lozenges for comfort.  Controlling fevers with ibuprofen or acetaminophen as directed by your caregiver.  Increasing usage of your inhaler if you have asthma. Zinc gel and zinc lozenges, taken in the first 24 hours of the common cold, can shorten the duration and lessen the severity of symptoms. Pain medicines may help with fever, muscle aches, and throat pain. A variety of non-prescription medicines are available to treat congestion and runny nose. Your caregiver   can make recommendations and may suggest nasal or lung inhalers for other symptoms.  HOME CARE INSTRUCTIONS   Only take over-the-counter or prescription medicines for pain, discomfort, or fever as directed by your  caregiver.  Use a warm mist humidifier or inhale steam from a shower to increase air moisture. This may keep secretions moist and make it easier to breathe.  Drink enough water and fluids to keep your urine clear or pale yellow.  Rest as needed.  Return to work when your temperature has returned to normal or as your caregiver advises. You may need to stay home longer to avoid infecting others. You can also use a face mask and careful hand washing to prevent spread of the virus. SEEK MEDICAL CARE IF:   After the first few days, you feel you are getting worse rather than better.  You need your caregiver's advice about medicines to control symptoms.  You develop chills, worsening shortness of breath, or brown or red sputum. These may be signs of pneumonia.  You develop yellow or brown nasal discharge or pain in the face, especially when you bend forward. These may be signs of sinusitis.  You develop a fever, swollen neck glands, pain with swallowing, or white areas in the back of your throat. These may be signs of strep throat. SEEK IMMEDIATE MEDICAL CARE IF:   You have a fever.  You develop severe or persistent headache, ear pain, sinus pain, or chest pain.  You develop wheezing, a prolonged cough, cough up blood, or have a change in your usual mucus (if you have chronic lung disease).  You develop sore muscles or a stiff neck. Document Released: 11/18/2000 Document Revised: 08/17/2011 Document Reviewed: 08/30/2013 ExitCare Patient Information 2015 [employer]. This information is not intended to replace advice given to you by your health care provider. Make sure you discuss any questions you have with your health care provider.  

## 2014-06-06 NOTE — ED Notes (Signed)
Dry cough x 2 months- ?allergies. Since Sunday she now has worsening cough and chest "fullness"- has been with family and around sick child

## 2014-08-20 ENCOUNTER — Telehealth: Payer: Self-pay | Admitting: Internal Medicine

## 2014-08-20 NOTE — Telephone Encounter (Signed)
FYI

## 2014-08-20 NOTE — Telephone Encounter (Signed)
Please advise 

## 2014-08-20 NOTE — Telephone Encounter (Signed)
Please put together two 15-minute  appointment or use an acute slot; if unable to find a place let me know

## 2014-08-20 NOTE — Telephone Encounter (Signed)
Appointment scheduled for 08/27/14

## 2014-08-20 NOTE — Telephone Encounter (Signed)
We are having to reschedule patient. Patient wants to know if we can get her in any sooner than June. She has been waiting to establish and is needing a referral for a colonoscopy. Best # [phone]

## 2014-08-27 ENCOUNTER — Ambulatory Visit (INDEPENDENT_AMBULATORY_CARE_PROVIDER_SITE_OTHER): Payer: BLUE CROSS/BLUE SHIELD | Admitting: Internal Medicine

## 2014-08-27 ENCOUNTER — Encounter: Payer: Self-pay | Admitting: Internal Medicine

## 2014-08-27 VITALS — BP 130/78 | HR 76 | Temp 97.6°F | Ht 62.0 in | Wt 132.2 lb

## 2014-08-27 DIAGNOSIS — M810 Age-related osteoporosis without current pathological fracture: Secondary | ICD-10-CM

## 2014-08-27 DIAGNOSIS — G43809 Other migraine, not intractable, without status migrainosus: Secondary | ICD-10-CM

## 2014-08-27 DIAGNOSIS — Z23 Encounter for immunization: Secondary | ICD-10-CM

## 2014-08-27 DIAGNOSIS — Z0000 Encounter for general adult medical examination without abnormal findings: Secondary | ICD-10-CM

## 2014-08-27 NOTE — Assessment & Plan Note (Signed)
Td 2015 Pnm shot today  Cscope 2006, request and GI referral  Diet and exercise discussed  Labs  Female care per Dr. Edwyna Ready last visit 06-2014

## 2014-08-27 NOTE — Assessment & Plan Note (Signed)
Migraines, doing better since her menopause Has sumatriptan in case she needs it

## 2014-08-27 NOTE — Progress Notes (Signed)
Pre visit review using our clinic review tool, if applicable. No additional management support is needed unless otherwise documented below in the visit note. 

## 2014-08-27 NOTE — Assessment & Plan Note (Signed)
  Osteoporosis, last bone density test 06-2011 T score -2.5.  Currently, problem is follow-up by the arthritis and osteoporosis consultants in [facility]

## 2014-08-27 NOTE — Progress Notes (Signed)
Subjective:    Patient ID: Laurie Johns, female    DOB: 10/30/1955, 59 y.o.   MRN: [id number]  DOS:  08/27/2014 Type of visit - description : tranferring from Dr Linda Hedges Interval history: In general doing well, request a physical exam Migraines well-controlled since she went on to her menopause, rarely takes abortive medication History of osteoporosis, status post 1 dose of Reclast Occasional cough on and off for a few weeks  , mostly at night, wonders if related to allergies, has 4 cats   Review of Systems Constitutional: No fever, chills. No unexplained wt changes. No unusual sweats HEENT: No dental problems, ear discharge, facial swelling, voice changes. No eye discharge, redness or intolerance to light Respiratory: No wheezing or difficulty breathing. + cough , no mucus production Cardiovascular: No CP, leg swelling or palpitations GI: no nausea, vomiting, diarrhea or abdominal pain.  No blood in the stools. No dysphagia   Endocrine: No polyphagia, polyuria or polydipsia GU: No dysuria, gross hematuria, difficulty urinating. No urinary urgency , + frequency, chronic at baseline. Musculoskeletal: No joint swellings or unusual aches or pains Skin: No change in the color of the skin, palor or rash Allergic, immunologic: No environmental allergies or food allergies Neurological: No dizziness or syncope. No headaches. No diplopia, slurred speech, motor deficits, facial numbness Hematological: No enlarged lymph nodes, easy bruising or bleeding Psychiatry: No suicidal ideas, hallucinations, behavior problems or confusion. No unusual/severe anxiety or depression.     Past Medical History  Diagnosis Date  . Cancer 10/2011    basal cell carcinoma, chest  . Osteoporosis   . Migraine     better after menopause   . Menopausal state     Past Surgical History  Procedure Laterality Date  . Hysterosalpilgogram, polyp removal ?    . G2p2      History   Social History  . Marital  Status: Married    Spouse Name: N/A  . Number of Children: 2  . Years of Education: 16   Occupational History  . photographer    Social History Main Topics  . Smoking status: Never Smoker   . Smokeless tobacco: Never Used  . Alcohol Use: 3.5 oz/week    7 Standard drinks or equivalent per week  . Drug Use: No  . Sexual Activity:    Partners: Male   Other Topics Concern  . Not on file   Social History Narrative   HSG, [employer].Married '80 Children: 2 sons; '84 and '87; 1 granddaughter, expecting one.    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    Caffeine use: daily, coffee     Family History  Problem Relation Age of Onset  . Dementia Mother   . Hypertension Mother   . Hypothyroidism Mother   . Hyperlipidemia Mother   . Osteoporosis Mother   . Osteoporosis Father   . Hypertension Father   . Osteoporosis Sister   . Hypothyroidism Sister   . Heart disease Brother     dx in his 31s  . Hypertension Brother   . Diabetes Maternal Grandmother   . COPD Neg Hx   . Colon cancer Neg Hx   . Breast cancer Neg Hx        Medication List       This list is accurate as of: 08/27/14  6:18 PM.  Always use your most recent med list.  CALCIUM 600 + D PO  Take 2 tablets by mouth daily.     CENTRUM SILVER PO  Take 1 tablet by mouth daily.     EPINEPHrine 0.3 mg/0.3 mL Devi  Commonly known as:  EPIPEN 2-PAK  Inject 0.3 mLs (0.3 mg total) into the muscle once.     FIBER PO  Take 2 tablets by mouth daily.     Fish Oil 1000 MG Caps  Take 2 capsules by mouth daily.     glucosamine-chondroitin 500-400 MG tablet  Take 1 tablet by mouth daily.     KS ALLERCLEAR PO  Take 1 tablet by mouth daily.     RECLAST IV  Inject into the vein. Once a year     SUMAtriptan 50 MG tablet  Commonly known as:  IMITREX  Take 1 tablet (50 mg total) by mouth once as needed for migraine.           Objective:   Physical  Exam BP 130/78 mmHg  Pulse 76  Temp(Src) 97.6 F (36.4 C) (Oral)  Ht 5\' 2"  (1.575 m)  Wt 132 lb 4 oz (59.988 kg)  BMI 24.18 kg/m2  SpO2 98% General:   Well developed, well nourished . NAD.  Neck:  Full range of motion. Supple. No  thyromegaly , normal carotid pulse HEENT:  Normocephalic . Face symmetric, atraumatic Lungs:  CTA B Normal respiratory effort, no intercostal retractions, no accessory muscle use. Heart: RRR,  no murmur.  Abdomen:  Not distended, soft, non-tender. No rebound or rigidity. No mass,organomegaly Muscle skeletal: no pretibial edema bilaterally  Skin: Exposed areas without rash. Not pale. Not jaundice Neurologic:  alert & oriented X3.  Speech normal, gait appropriate for age and unassisted Strength symmetric and appropriate for age.  Psych: Cognition and judgment appear intact.  Cooperative with normal attention span and concentration.  Behavior appropriate. No anxious or depressed appearing.       Assessment & Plan:      Occasional cough, allergies? Recommend Flonase

## 2014-08-27 NOTE — Patient Instructions (Addendum)
  Please schedule labs to be done within few days (fasting)  For cough, recommend Flonase 2 sprays in each side of the nose consistently. If not improving the me know   Come back to the office in 1 year   for a physical exam  Please schedule an appointment at the front desk    Come back fasting

## 2014-08-28 ENCOUNTER — Other Ambulatory Visit (INDEPENDENT_AMBULATORY_CARE_PROVIDER_SITE_OTHER): Payer: BLUE CROSS/BLUE SHIELD

## 2014-08-28 DIAGNOSIS — Z0000 Encounter for general adult medical examination without abnormal findings: Secondary | ICD-10-CM

## 2014-08-28 LAB — LIPID PANEL
CHOLESTEROL: 208 mg/dL — AB (ref 0–200)
HDL: 72.4 mg/dL (ref >39.00)
LDL CALC: 123 mg/dL — AB (ref 0–99)
NonHDL: 135.6
TRIGLYCERIDES: 64 mg/dL (ref 0.0–149.0)
Total CHOL/HDL Ratio: 3
VLDL: 12.8 mg/dL (ref 0.0–40.0)

## 2014-08-28 LAB — COMPREHENSIVE METABOLIC PANEL
ALBUMIN: 4.3 g/dL (ref 3.5–5.2)
ALT: 17 U/L (ref 0–35)
AST: 21 U/L (ref 0–37)
Alkaline Phosphatase: 60 U/L (ref 39–117)
BUN: 20 mg/dL (ref 6–23)
CALCIUM: 9.4 mg/dL (ref 8.4–10.5)
CHLORIDE: 103 meq/L (ref 96–112)
CO2: 27 meq/L (ref 19–32)
CREATININE: 0.96 mg/dL (ref 0.40–1.20)
GFR: 62.85 mL/min (ref >60.00)
Glucose, Bld: 87 mg/dL (ref 70–99)
POTASSIUM: 4.2 meq/L (ref 3.5–5.1)
Sodium: 137 mEq/L (ref 135–145)
Total Bilirubin: 0.6 mg/dL (ref 0.2–1.2)
Total Protein: 7.1 g/dL (ref 6.0–8.3)

## 2014-08-28 LAB — CBC WITH DIFFERENTIAL/PLATELET
BASOS PCT: 0.4 % (ref 0.0–3.0)
Basophils Absolute: 0 10*3/uL (ref 0.0–0.1)
EOS ABS: 0.1 10*3/uL (ref 0.0–0.7)
Eosinophils Relative: 2.4 % (ref 0.0–5.0)
HCT: 42.4 % (ref 36.0–46.0)
Hemoglobin: 14.6 g/dL (ref 12.0–15.0)
Lymphocytes Relative: 34.1 % (ref 12.0–46.0)
Lymphs Abs: 2.1 10*3/uL (ref 0.7–4.0)
MCHC: 34.3 g/dL (ref 30.0–36.0)
MCV: 88.3 fl (ref 78.0–100.0)
Monocytes Absolute: 0.5 10*3/uL (ref 0.1–1.0)
Monocytes Relative: 8.2 % (ref 3.0–12.0)
NEUTROS ABS: 3.4 10*3/uL (ref 1.4–7.7)
NEUTROS PCT: 54.9 % (ref 43.0–77.0)
Platelets: 202 10*3/uL (ref 150.0–400.0)
RBC: 4.8 Mil/uL (ref 3.87–5.11)
RDW: 13.2 % (ref 11.5–15.5)
WBC: 6.1 10*3/uL (ref 4.0–10.5)

## 2014-08-28 LAB — TSH: TSH: 2.98 u[IU]/mL (ref 0.35–4.50)

## 2014-08-28 LAB — VITAMIN D 25 HYDROXY (VIT D DEFICIENCY, FRACTURES): VITD: 56.76 ng/mL (ref 30.00–100.00)

## 2014-08-30 ENCOUNTER — Encounter: Payer: Self-pay | Admitting: Gastroenterology

## 2014-09-04 ENCOUNTER — Encounter: Payer: Self-pay | Admitting: Medical

## 2014-09-04 ENCOUNTER — Ambulatory Visit (INDEPENDENT_AMBULATORY_CARE_PROVIDER_SITE_OTHER): Payer: BLUE CROSS/BLUE SHIELD | Admitting: Medical

## 2014-09-04 VITALS — BP 128/80 | HR 92 | Temp 99.3°F | Ht 62.0 in | Wt 130.2 lb

## 2014-09-04 DIAGNOSIS — J0100 Acute maxillary sinusitis, unspecified: Secondary | ICD-10-CM | POA: Diagnosis not present

## 2014-09-04 MED ORDER — BENZONATATE 100 MG PO CAPS: 100.0000 mg | ORAL_CAPSULE | Freq: Three times a day (TID) | ORAL | Status: DC | PRN

## 2014-09-04 MED ORDER — AZITHROMYCIN 250 MG PO TABS: ORAL_TABLET | ORAL | Status: DC

## 2014-09-04 NOTE — Assessment & Plan Note (Addendum)
Following onset of allergies. Possible rt om on exam and with productive cough possible bronchitis as well.  Rx of azithromycin antibiotic Rx of benzonatate for cough Continue otc antihistamine and get flonase otc.  If by Friday not feeling better let us know and could get cxr.  Follow up as needed. You should improve but if worsen while out of town see provider there.

## 2014-09-04 NOTE — Progress Notes (Signed)
Pre visit review using our clinic review tool, if applicable. No additional management support is needed unless otherwise documented below in the visit note. 

## 2014-09-04 NOTE — Progress Notes (Signed)
Subjective:    Patient ID: Laurie Johns, female    DOB: 1956-05-15, 59 y.o.   MRN: [id number]  HPI   Pt in stating she just got cough on Saturday. Pt did camped on Friday night. Little cold. Then Saturday and Sunday some chest congestion. By Sunday she started coughing up mucous. Recently some nasal and chest congestion. Hx of pneumonia bilateral one time 28 years ago. Recent pneumonia vaccine last Monday.  Pt is about to go to [place] on Friday. She will be out of town for about 2 weeks.  Pt thinks she is getting more allergies past couple of years.    Review of Systems  Constitutional: Negative for fever, chills and fatigue.  HENT: Positive for congestion, ear pain, sinus pressure and sneezing.        Nasal and chest. Rare sneeze.  Respiratory: Positive for cough. Negative for chest tightness and wheezing.        Some productive. And chest congestion.  Cardiovascular: Negative for chest pain and palpitations.  Musculoskeletal: Negative for back pain.  Neurological: Negative for dizziness and headaches.  Hematological: Negative for adenopathy. Does not bruise/bleed easily.   Past Medical History  Diagnosis Date  . Cancer 10/2011    basal cell carcinoma, chest  . Osteoporosis   . Migraine     better after menopause   . Menopausal state     History   Social History  . Marital Status: Married    Spouse Name: N/A  . Number of Children: 2  . Years of Education: 16   Occupational History  . photographer    Social History Main Topics  . Smoking status: Never Smoker   . Smokeless tobacco: Never Used  . Alcohol Use: 3.5 oz/week    7 Standard drinks or equivalent per week  . Drug Use: No  . Sexual Activity:    Partners: Male   Other Topics Concern  . Not on file   Social History Narrative   HSG, [employer].Married '80 Children: 2 sons; '84 and '87; 1 granddaughter, expecting one.    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  Marriage is doing  well.    Caffeine use: daily, coffee    Past Surgical History  Procedure Laterality Date  . Hysterosalpilgogram, polyp removal ?    . G2p2      Family History  Problem Relation Age of Onset  . Dementia Mother   . Hypertension Mother   . Hypothyroidism Mother   . Hyperlipidemia Mother   . Osteoporosis Mother   . Osteoporosis Father   . Hypertension Father   . Osteoporosis Sister   . Hypothyroidism Sister   . Heart disease Brother     dx in his 78s  . Hypertension Brother   . Diabetes Maternal Grandmother   . COPD Neg Hx   . Colon cancer Neg Hx   . Breast cancer Neg Hx     Allergies  Allergen Reactions  . Bee Venom Anaphylaxis  . Codeine     REACTION: causes gi upset  . Other Rash    Make-up    Current Outpatient Prescriptions on File Prior to Visit  Medication Sig Dispense Refill  . Calcium Carb-Cholecalciferol (CALCIUM 600 + D PO) Take 2 tablets by mouth daily.    Marland Kitchen EPINEPHrine (EPIPEN 2-PAK) 0.3 mg/0.3 mL DEVI Inject 0.3 mLs (0.3 mg total) into the muscle once. 2 Device 2  . FIBER PO Take 2 tablets by mouth  daily.    . glucosamine-chondroitin 500-400 MG tablet Take 1 tablet by mouth daily.    . Loratadine (KS ALLERCLEAR PO) Take 1 tablet by mouth daily.    . Multiple Vitamins-Minerals (CENTRUM SILVER PO) Take 1 tablet by mouth daily.    . Omega-3 Fatty Acids (FISH OIL) 1000 MG CAPS Take 2 capsules by mouth daily.    . SUMAtriptan (IMITREX) 50 MG tablet Take 1 tablet (50 mg total) by mouth once as needed for migraine. 30 tablet 2  . Zoledronic Acid (RECLAST IV) Inject into the vein. Once a year     No current facility-administered medications on file prior to visit.    BP 128/80 mmHg  Pulse 92  Temp(Src) 99.3 F (37.4 C) (Oral)  Ht 5\' 2"  (1.575 m)  Wt 130 lb 3.2 oz (59.058 kg)  BMI 23.81 kg/m2  SpO2 100%      Objective:   Physical Exam  General  Mental Status - Alert. General Appearance - Well groomed. Not in acute distress.  Skin Rashes- No  Rashes.  HEENT Head- Normal. Ear Auditory Canal - Left- Normal. Right - Normal.Tympanic Membrane- Left- Normal. Right- Mild red. Eye Sclera/Conjunctiva- Left- Normal. Right- Normal. Nose & Sinuses Nasal Mucosa- Left-  Boggy and Congested. Right-  Boggy and  Congested.Bilateral maxillary and frontal sinus pressure. Mouth & Throat Lips: Upper Lip- Normal: no dryness, cracking, pallor, cyanosis, or vesicular eruption. Lower Lip-Normal: no dryness, cracking, pallor, cyanosis or vesicular eruption. Buccal Mucosa- Bilateral- No Aphthous ulcers. Oropharynx- No Discharge or Erythema. +pnd. Tonsils: Characteristics- Bilateral- No Erythema or Congestion. Size/Enlargement- Bilateral- No enlargement. Discharge- bilateral-None.  Neck Neck- Supple. No Masses.   Chest and Lung Exam Auscultation: Breath Sounds:-Clear even and unlabored.  Cardiovascular Auscultation:Rythm- Regular, rate and rhythm. Murmurs & Other Heart Sounds:Ausculatation of the heart reveal- No Murmurs.  Lymphatic Head & Neck General Head & Neck Lymphatics: Bilateral: Description- No Localized lymphadenopathy.       Assessment & Plan:

## 2014-09-04 NOTE — Patient Instructions (Addendum)
Sinusitis, acute maxillary Following onset of allergies. Possible rt om on exam and with productive cough possible bronchitis as well.  Rx of azithromycin antibiotic Rx of benzonatate for cough Continue otc antihistamine and get flonase otc.  If by Friday not feeling better let us know and could get cxr.  Follow up as needed. You should improve but if worsen while out of town see provider there.

## 2014-09-05 ENCOUNTER — Other Ambulatory Visit: Payer: Self-pay

## 2014-09-05 ENCOUNTER — Encounter: Payer: Self-pay | Admitting: Internal Medicine

## 2014-09-17 ENCOUNTER — Ambulatory Visit: Payer: Self-pay | Admitting: Internal Medicine

## 2014-10-15 ENCOUNTER — Encounter: Payer: Self-pay | Admitting: Gastroenterology

## 2014-10-16 ENCOUNTER — Ambulatory Visit (AMBULATORY_SURGERY_CENTER): Payer: Self-pay

## 2014-10-16 VITALS — Ht 62.0 in | Wt 131.8 lb

## 2014-10-16 DIAGNOSIS — Z1211 Encounter for screening for malignant neoplasm of colon: Secondary | ICD-10-CM

## 2014-10-16 NOTE — Progress Notes (Signed)
Per pt, no allergies to soy or egg products.Pt not taking any weight loss meds or using  O2 at home. 

## 2014-10-31 ENCOUNTER — Ambulatory Visit (AMBULATORY_SURGERY_CENTER): Payer: BLUE CROSS/BLUE SHIELD | Admitting: Gastroenterology

## 2014-10-31 ENCOUNTER — Encounter: Payer: Self-pay | Admitting: Gastroenterology

## 2014-10-31 VITALS — BP 106/42 | HR 69 | Temp 97.8°F | Resp 20 | Ht 62.0 in | Wt 131.0 lb

## 2014-10-31 DIAGNOSIS — Z1211 Encounter for screening for malignant neoplasm of colon: Secondary | ICD-10-CM

## 2014-10-31 MED ORDER — SODIUM CHLORIDE 0.9 % IV SOLN: 500.0000 mL | INTRAVENOUS | Status: DC

## 2014-10-31 NOTE — Patient Instructions (Signed)
Discharge instructions given. Handouts on diverticulosis and a high fiber diet Resume previous medications. YOU HAD AN ENDOSCOPIC PROCEDURE TODAY AT THE [facility]:   Refer to the procedure report that was given to you for any specific questions about what was found during the examination.  If the procedure report does not answer your questions, please call your gastroenterologist to clarify.  If you requested that your care partner not be given the details of your procedure findings, then the procedure report has been included in a sealed envelope for you to review at your convenience later.  YOU SHOULD EXPECT: Some feelings of bloating in the abdomen. Passage of more gas than usual.  Walking can help get rid of the air that was put into your GI tract during the procedure and reduce the bloating. If you had a lower endoscopy (such as a colonoscopy or flexible sigmoidoscopy) you may notice spotting of blood in your stool or on the toilet paper. If you underwent a bowel prep for your procedure, you may not have a normal bowel movement for a few days.  Please Note:  You might notice some irritation and congestion in your nose or some drainage.  This is from the oxygen used during your procedure.  There is no need for concern and it should clear up in a day or so.  SYMPTOMS TO REPORT IMMEDIATELY:   Following lower endoscopy (colonoscopy or flexible sigmoidoscopy):  Excessive amounts of blood in the stool  Significant tenderness or worsening of abdominal pains  Swelling of the abdomen that is new, acute  Fever of 100F or higher   For urgent or emergent issues, a gastroenterologist can be reached at any hour by calling [phone].   DIET: Your first meal following the procedure should be a small meal and then it is ok to progress to your normal diet. Heavy or fried foods are harder to digest and may make you feel nauseous or bloated.  Likewise, meals heavy in dairy and vegetables  can increase bloating.  Drink plenty of fluids but you should avoid alcoholic beverages for 24 hours.  ACTIVITY:  You should plan to take it easy for the rest of today and you should NOT DRIVE or use heavy machinery until tomorrow (because of the sedation medicines used during the test).    FOLLOW UP: Our staff will call the number listed on your records the next business day following your procedure to check on you and address any questions or concerns that you may have regarding the information given to you following your procedure. If we do not reach you, we will leave a message.  However, if you are feeling well and you are not experiencing any problems, there is no need to return our call.  We will assume that you have returned to your regular daily activities without incident.  If any biopsies were taken you will be contacted by phone or by letter within the next 1-3 weeks.  Please call us at [phone] if you have not heard about the biopsies in 3 weeks.    SIGNATURES/CONFIDENTIALITY: You and/or your care partner have signed paperwork which will be entered into your electronic medical record.  These signatures attest to the fact that that the information above on your After Visit Summary has been reviewed and is understood.  Full responsibility of the confidentiality of this discharge information lies with you and/or your care-partner. 

## 2014-10-31 NOTE — Progress Notes (Signed)
Report to PACU, RN, vss, BBS= Clear.  

## 2014-10-31 NOTE — Op Note (Signed)
[facility]  [address] [place], [id number]   COLONOSCOPY PROCEDURE REPORT  PATIENT: Laurie Johns, Laurie Johns  MR#: [id number] BIRTHDATE: Oct 07, 1955 , 58  yrs. old GENDER: female ENDOSCOPIST: Ladene Artist, MD, [facility] REFERRED BY:Paz, Jose PROCEDURE DATE:  10/31/2014 PROCEDURE:   Colonoscopy, screening First Screening Colonoscopy - Avg.  risk and is 50 yrs.  old or older - No.  Prior Negative Screening - Now for repeat screening. 10 or more years since last screening  History of Adenoma - Now for follow-up colonoscopy & has been > or = to 3 yrs.  N/A  Polyps removed today? No Recommend repeat exam, <10 yrs? No ASA CLASS:   Class II INDICATIONS:Screening for colonic neoplasia and Colorectal Neoplasm Risk Assessment for this procedure is average risk. MEDICATIONS: Monitored anesthesia care and Propofol 300 mg IV DESCRIPTION OF PROCEDURE:   After the risks benefits and alternatives of the procedure were thoroughly explained, informed consent was obtained.  The digital rectal exam revealed no abnormalities of the rectum.   The [id number]  endoscope was introduced through the anus and advanced to the cecum, which was identified by both the appendix and ileocecal valve. No adverse events experienced.   The quality of the prep was good after extensive rinsing and suctioning.  (Suprep was used)  The instrument was then slowly withdrawn as the colon was fully examined. Estimated blood loss is zero unless otherwise noted in this procedure report.    COLON FINDINGS: There was mild diverticulosis noted in the sigmoid colon.   The examination was otherwise normal.  Retroflexed views revealed no abnormalities. The time to cecum = 4.1 Withdrawal time = 14.5   The scope was withdrawn and the procedure completed. COMPLICATIONS: There were no immediate complications.  ENDOSCOPIC IMPRESSION: 1.   Mild diverticulosis in the sigmoid colon 2.   The examination was otherwise  normal  RECOMMENDATIONS: 1.  High fiber diet with liberal fluid intake. 2.  Continue current colorectal screening recommendations for "routine risk" patients with a repeat colonoscopy in 10 years.  eSigned:  Ladene Artist, MD, [facility] 10/31/2014 1:49 PM   [C

## 2014-11-01 ENCOUNTER — Telehealth: Payer: Self-pay | Admitting: *Deleted

## 2014-11-01 NOTE — Telephone Encounter (Signed)
  Follow up Call-  Call back number 10/31/2014  Post procedure Call Back phone  # [phone], [phone]  Permission to leave phone message Yes     Patient questions:  Do you have a fever, pain , or abdominal swelling? No. Pain Score  0 *  Have you tolerated food without any problems? Yes.    Have you been able to return to your normal activities? Yes.    Do you have any questions about your discharge instructions: Diet   No. Medications  No. Follow up visit  No.  Do you have questions or concerns about your Care? No.  Actions: * If pain score is 4 or above: No action needed, pain <4.

## 2014-12-17 ENCOUNTER — Ambulatory Visit: Payer: Self-pay | Admitting: Internal Medicine

## 2015-10-21 DIAGNOSIS — M810 Age-related osteoporosis without current pathological fracture: Secondary | ICD-10-CM | POA: Diagnosis not present

## 2015-10-21 DIAGNOSIS — M1712 Unilateral primary osteoarthritis, left knee: Secondary | ICD-10-CM | POA: Diagnosis not present

## 2015-10-21 DIAGNOSIS — Z7983 Long term (current) use of bisphosphonates: Secondary | ICD-10-CM | POA: Diagnosis not present

## 2015-11-20 ENCOUNTER — Encounter: Payer: Self-pay | Admitting: Internal Medicine

## 2015-11-20 ENCOUNTER — Ambulatory Visit (INDEPENDENT_AMBULATORY_CARE_PROVIDER_SITE_OTHER): Payer: BLUE CROSS/BLUE SHIELD | Admitting: Internal Medicine

## 2015-11-20 VITALS — BP 122/72 | HR 73 | Temp 98.2°F | Ht 62.0 in | Wt 120.1 lb

## 2015-11-20 DIAGNOSIS — Z23 Encounter for immunization: Secondary | ICD-10-CM | POA: Diagnosis not present

## 2015-11-20 DIAGNOSIS — Z0000 Encounter for general adult medical examination without abnormal findings: Secondary | ICD-10-CM | POA: Diagnosis not present

## 2015-11-20 NOTE — Progress Notes (Signed)
Subjective:    Patient ID: Laurie Johns, female    DOB: 05-14-1956, 60 y.o.   MRN: [id number]  DOS:  11/20/2015 Type of visit - description : CPX Interval history: No major concerns, she sees a number of other doctors.  Review of Systems  Constitutional: No fever. No chills. No unexplained wt changes. No unusual sweats  HEENT: No dental problems, no ear discharge, no facial swelling, no voice changes. No eye discharge, no eye  redness , no  intolerance to light   Respiratory: No wheezing , no  difficulty breathing. No cough , no mucus production  Cardiovascular: No CP, no leg swelling , no  Palpitations  GI: no nausea, no vomiting, no diarrhea , no  abdominal pain.  No blood in the stools. No dysphagia, no odynophagia    Endocrine: No polyphagia, no polyuria , no polydipsia  GU: No dysuria, gross hematuria, difficulty urinating. Occasional urinary frequency, no urgency, chronic symptoms.  Musculoskeletal: Some neck pain, diagnosed with DJD  Skin: No change in the color of the skin, palor , no  Rash  Allergic, immunologic: No environmental allergies , no  food allergies  Neurological: No dizziness no  syncope. No headaches. No diplopia, no slurred, no slurred speech, no motor deficits, no facial  Numbness  Hematological: No enlarged lymph nodes, no easy bruising , no unusual bleedings  Psychiatry: No suicidal ideas, no hallucinations, no beavior problems, no confusion.  No unusual/severe anxiety, no depression  Past Medical History  Diagnosis Date  . Cancer ([place]) 10/2011    basal cell carcinoma, chest  . Osteoporosis   . Migraine     better after menopause   . Menopausal state   . Basal cell carcinoma in situ of skin of right shoulder   . 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 Melanoma ([place])     left thigh  . Anxiety   . Post-operative nausea and vomiting     Past Surgical History  Procedure Laterality Date  . Hysterosalpilgogram, polyp  removal ?    . G2p2    . Colonoscopy      Social History   Social History  . Marital Status: Married    Spouse Name: N/A  . Number of Children: 2  . Years of Education: 16   Occupational History  . photographer    Social History Main Topics  . Smoking status: Never Smoker   . Smokeless tobacco: Never Used  . Alcohol Use: 4.2 oz/week    7 Glasses of wine per week  . Drug Use: No  . Sexual Activity:    Partners: Male   Other Topics Concern  . Not on file   Social History Narrative   HSG, [employer].Married '80 Children: 2 sons; '84 and '87; 2 grandchildren.    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          Family History  Problem Relation Age of Onset  . Dementia Mother   . Hypertension Mother   . Hypothyroidism Mother   . Hyperlipidemia Mother   . Osteoporosis Mother   . Osteoporosis Father   . Hypertension Father   . Osteoporosis Sister   . Hypothyroidism Sister   . Heart disease Brother     dx in his 43s  . Hypertension Brother   . Diabetes Maternal Grandmother   . COPD Neg Hx   . Colon cancer Neg Hx   .  Breast cancer Neg Hx   . Polycystic kidney disease Other       Medication List       This list is accurate as of: 11/20/15 11:59 PM.  Always use your most recent med list.               aspirin-acetaminophen-caffeine 250-250-65 MG tablet  Commonly known as:  EXCEDRIN MIGRAINE  Take by mouth as needed for headache.     B-12 (Methylcobalamin) 1000 MCG Subl  Place under the tongue every other day.     CALCIUM 600 + D PO  Take 1 tablet by mouth 2 (two) times daily.     CENTRUM SILVER PO  Take 1 tablet by mouth daily.     EPINEPHrine 0.3 mg/0.3 mL Devi  Commonly known as:  EPIPEN 2-PAK  Inject 0.3 mLs (0.3 mg total) into the muscle once.     FIBER PO  Take 2 tablets by mouth daily.     Fish Oil 1000 MG Caps  Take 2 capsules by mouth daily.     glucosamine-chondroitin 500-400 MG tablet    Take 1 tablet by mouth every other day.     KS ALLERCLEAR PO  Take 1 tablet by mouth daily.     RECLAST IV  Inject into the vein. Once a year     SUMAtriptan 50 MG tablet  Commonly known as:  IMITREX  Take 1 tablet (50 mg total) by mouth once as needed for migraine.     TURMERIC PO  Take by mouth daily.     vitamin C 500 MG tablet  Commonly known as:  ASCORBIC ACID  Take 500 mg by mouth every other day.           Objective:   Physical Exam BP 122/72 mmHg  Pulse 73  Temp(Src) 98.2 F (36.8 C) (Oral)  Ht 5\' 2"  (1.575 m)  Wt 120 lb 2 oz (54.488 kg)  BMI 21.97 kg/m2  SpO2 98% General:   Well developed, well nourished . NAD.  Neck: No  thyromegaly  HEENT:  Normocephalic . Face symmetric, atraumatic Breast: no dominant mass, skin and nipples normal to inspection on palpation, axillary areas without mass or lymphadenopathy Lungs:  CTA B Normal respiratory effort, no intercostal retractions, no accessory muscle use. Heart: RRR,  no murmur.  No pretibial edema bilaterally  Abdomen:  Not distended, soft, non-tender. No rebound or rigidity.   Skin: Exposed areas without rash. Not pale. Not jaundice Neurologic:  alert & oriented X3.  Speech normal, gait appropriate for age and unassisted Strength symmetric and appropriate for age.  Psych: Cognition and judgment appear intact.  Cooperative with normal attention span and concentration.  Behavior appropriate. No anxious or depressed appearing.     Assessment & Plan:   Assessment Anxiety - occ sx (son dx w/ bipolar) DJD- sees MD in [facility] Osteoporosis- sees MD in [place], on reclast Migraines, better after menopause Menopausal state Skin cancer: Melanoma, BCC, SCC ----> Sees dermatology yearly Bee stings allergy-- has a epipen  PLAN: DJD: occ  knee pain, follow-up elsewhere Osteoporosis: Follow-up elsewhere. RTC 1 year

## 2015-11-20 NOTE — Progress Notes (Signed)
Pre visit review using our clinic review tool, if applicable. No additional management support is needed unless otherwise documented below in the visit note. 

## 2015-11-20 NOTE — Patient Instructions (Signed)
GO TO THE LAB : Get the blood work     GO TO THE FRONT DESK Schedule your next appointment for a routine checkup in 1 year.    

## 2015-11-20 NOTE — Assessment & Plan Note (Addendum)
Td 2015 ; Pnm shot 2016; prevnar today; prefers to hold on zostavax , going on a trip soon  [place] 2006, repeated cscope 10-2014 no polyps, next per GI Breast exam at patient request: Normal. MMG @ [facility] last within 1 year, no report Female care per Dr. Edwyna Ready  Diet and exercise discussed EKG nsr Labs: FLP, BMP

## 2015-11-21 DIAGNOSIS — Z09 Encounter for follow-up examination after completed treatment for conditions other than malignant neoplasm: Secondary | ICD-10-CM | POA: Insufficient documentation

## 2015-11-21 LAB — LIPID PANEL
CHOLESTEROL: 230 mg/dL — AB (ref 0–200)
HDL: 75.6 mg/dL (ref >39.00)
LDL Cholesterol: 137 mg/dL — ABNORMAL HIGH (ref 0–99)
NonHDL: 154.89
TRIGLYCERIDES: 90 mg/dL (ref 0.0–149.0)
Total CHOL/HDL Ratio: 3
VLDL: 18 mg/dL (ref 0.0–40.0)

## 2015-11-21 LAB — BASIC METABOLIC PANEL
BUN: 25 mg/dL — AB (ref 6–23)
CO2: 30 mEq/L (ref 19–32)
Calcium: 9.3 mg/dL (ref 8.4–10.5)
Chloride: 103 mEq/L (ref 96–112)
Creatinine, Ser: 0.89 mg/dL (ref 0.40–1.20)
GFR: 68.31 mL/min (ref >60.00)
GLUCOSE: 88 mg/dL (ref 70–99)
Potassium: 3.9 mEq/L (ref 3.5–5.1)
SODIUM: 139 meq/L (ref 135–145)

## 2015-11-21 LAB — HIV ANTIBODY (ROUTINE TESTING W REFLEX): HIV 1&2 Ab, 4th Generation: NONREACTIVE

## 2015-11-21 NOTE — Assessment & Plan Note (Signed)
DJD: occ  knee pain, follow-up elsewhere Osteoporosis: Follow-up elsewhere. RTC 1 year

## 2016-01-09 DIAGNOSIS — M810 Age-related osteoporosis without current pathological fracture: Secondary | ICD-10-CM | POA: Diagnosis not present

## 2016-02-25 ENCOUNTER — Telehealth: Payer: Self-pay | Admitting: Internal Medicine

## 2016-02-25 DIAGNOSIS — Z1159 Encounter for screening for other viral diseases: Secondary | ICD-10-CM

## 2016-02-25 NOTE — Telephone Encounter (Signed)
Relation to [id number] Call back number:[phone]   Reason for call:  Patient requesting shingle vaccination, patient checked thru insurance, insurance will cover as long as Dx is preventive. Patient would like her hep c completed at the time of her shingles, flu shot. Please advise

## 2016-02-25 NOTE — Telephone Encounter (Signed)
Hep C ordered as well, however, please inform Pt that insurance may not cover as it is not a "preventative test."

## 2016-02-25 NOTE — Telephone Encounter (Signed)
Please advise 

## 2016-02-25 NOTE — Telephone Encounter (Signed)
Okay to proceed with shots. thx

## 2016-02-25 NOTE — Telephone Encounter (Signed)
Patient scheduled for 02/26/2016 Wednesday with the nurse.

## 2016-02-25 NOTE — Addendum Note (Signed)
Addended byDamita Dunnings D on: 02/25/2016 01:26 PM   Modules accepted: Orders

## 2016-02-26 ENCOUNTER — Other Ambulatory Visit: Payer: BLUE CROSS/BLUE SHIELD

## 2016-02-26 ENCOUNTER — Ambulatory Visit (INDEPENDENT_AMBULATORY_CARE_PROVIDER_SITE_OTHER): Payer: BLUE CROSS/BLUE SHIELD | Admitting: Behavioral Health

## 2016-02-26 DIAGNOSIS — Z23 Encounter for immunization: Secondary | ICD-10-CM | POA: Diagnosis not present

## 2016-02-26 DIAGNOSIS — Z0000 Encounter for general adult medical examination without abnormal findings: Secondary | ICD-10-CM | POA: Diagnosis not present

## 2016-02-26 NOTE — Progress Notes (Addendum)
Pre visit review using our clinic review tool, if applicable. No additional management support is needed unless otherwise documented below in the visit note.  Patient in clinic today for Shingles and Influenza vaccination. Patient tolerated both injections well. No signs or symptoms of a reaction prior to leaving the nurse visit.

## 2016-02-28 DIAGNOSIS — Z1231 Encounter for screening mammogram for malignant neoplasm of breast: Secondary | ICD-10-CM | POA: Diagnosis not present

## 2016-03-05 DIAGNOSIS — L281 Prurigo nodularis: Secondary | ICD-10-CM | POA: Diagnosis not present

## 2016-03-05 DIAGNOSIS — L821 Other seborrheic keratosis: Secondary | ICD-10-CM | POA: Diagnosis not present

## 2016-03-05 DIAGNOSIS — D2372 Other benign neoplasm of skin of left lower limb, including hip: Secondary | ICD-10-CM | POA: Diagnosis not present

## 2016-03-05 DIAGNOSIS — Z85828 Personal history of other malignant neoplasm of skin: Secondary | ICD-10-CM | POA: Diagnosis not present

## 2016-07-20 ENCOUNTER — Ambulatory Visit (INDEPENDENT_AMBULATORY_CARE_PROVIDER_SITE_OTHER): Payer: BLUE CROSS/BLUE SHIELD | Admitting: Internal Medicine

## 2016-07-20 ENCOUNTER — Encounter: Payer: Self-pay | Admitting: Internal Medicine

## 2016-07-20 VITALS — BP 118/72 | HR 94 | Temp 98.5°F | Resp 12 | Ht 62.0 in | Wt 125.5 lb

## 2016-07-20 DIAGNOSIS — B349 Viral infection, unspecified: Secondary | ICD-10-CM

## 2016-07-20 MED ORDER — OSELTAMIVIR PHOSPHATE 75 MG PO CAPS: 75.0000 mg | ORAL_CAPSULE | Freq: Two times a day (BID) | ORAL | 0 refills | Status: DC

## 2016-07-20 MED ORDER — ALBUTEROL SULFATE HFA 108 (90 BASE) MCG/ACT IN AERS: 2.0000 | INHALATION_SPRAY | Freq: Four times a day (QID) | RESPIRATORY_TRACT | 1 refills | Status: DC | PRN

## 2016-07-20 NOTE — Progress Notes (Signed)
Subjective:    Patient ID: Laurie Johns, female    DOB: 25-Apr-1956, 61 y.o.   MRN: [id number]  DOS:  07/20/2016 Type of visit - description : Acute visit Interval history: She went camping during the weekend, spend time with her granddaughter. Her granddaughter developed fever and today was diagnosed with influenza. The patient reports mild cough with yellow sputum production since yesterday, mild aches and pains. She has a remote history of asthma and has been wheezing a little.   Review of Systems Denies fever but has some chills No nausea, vomiting, diarrhea. Mild sinus congestion Past Medical History:  Diagnosis Date  . Anxiety   . Basal cell carcinoma in situ of skin of right shoulder   . Cancer ([place]) 10/2011   basal cell carcinoma, chest  . Melanoma ([facility])    left thigh  . Menopausal state   . Migraine    better after menopause   . Osteoporosis   . Post-operative nausea and vomiting   . 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 COLONOSCOPY    . G2P2    . hysterosalpilgogram, polyp removal ?      Social History   Social History  . Marital status: Married    Spouse name: N/A  . Number of children: 2  . Years of education: 16   Occupational History  . photographer    Social History Main Topics  . Smoking status: Never Smoker  . Smokeless tobacco: Never Used  . Alcohol use 4.2 oz/week    7 Glasses of wine per week  . Drug use: No  . Sexual activity: Yes    Partners: Male   Other Topics Concern  . Not on file   Social History Narrative   HSG, [employer].Married '80 Children: 2 sons; '84 and '87; 2 grandchildren.    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           Allergies as of 07/20/2016      Reactions   Bee Venom Anaphylaxis   Codeine    REACTION: causes gi upset   Other Rash   Make-up      Medication List       Accurate  as of 07/20/16  9:06 PM. Always use your most recent med list.          albuterol 108 (90 Base) MCG/ACT inhaler Commonly known as:  VENTOLIN HFA Inhale 2 puffs into the lungs every 6 (six) hours as needed for wheezing or shortness of breath.   aspirin-acetaminophen-caffeine [id number] MG tablet Commonly known as:  EXCEDRIN MIGRAINE Take by mouth as needed for headache.   B-12 (Methylcobalamin) 1000 MCG Subl Place under the tongue every other day.   CALCIUM 600 + D PO Take 1 tablet by mouth 2 (two) times daily.   CENTRUM SILVER PO Take 1 tablet by mouth daily.   EPINEPHrine 0.3 mg/0.3 mL Devi Commonly known as:  EPIPEN 2-PAK Inject 0.3 mLs (0.3 mg total) into the muscle once.   FIBER PO Take 2 tablets by mouth daily.   Fish Oil 1000 MG Caps Take 2 capsules by mouth daily.   glucosamine-chondroitin 500-400 MG tablet Take 1 tablet by mouth every other day.   KS ALLERCLEAR PO Take 1 tablet by mouth daily.   oseltamivir 75 MG capsule Commonly known as:  TAMIFLU Take 1 capsule (  75 mg total) by mouth 2 (two) times daily.   RECLAST IV Inject into the vein. Once a year   SUMAtriptan 50 MG tablet Commonly known as:  IMITREX Take 1 tablet (50 mg total) by mouth once as needed for migraine.   TURMERIC PO Take by mouth daily.   vitamin C 500 MG tablet Commonly known as:  ASCORBIC ACID Take 500 mg by mouth every other day.          Objective:   Physical Exam BP 118/72 (BP Location: Left Arm, Patient Position: Sitting, Cuff Size: Small)   Pulse 94   Temp 98.5 F (36.9 C) (Oral)   Resp 12   Ht 5\' 2"  (1.575 m)   Wt 125 lb 8 oz (56.9 kg)   SpO2 98%   BMI 22.95 kg/m  General:   Well developed, well nourished . NAD.  HEENT:  Normocephalic . Face symmetric, atraumatic. TMs normal, throat symmetric. Lungs:  CTA B except for mild end expiratory wheezing. Normal respiratory effort, no intercostal retractions, no accessory muscle use. Heart: RRR,  no murmur.  No  pretibial edema bilaterally  Skin: Not pale. Not jaundice Neurologic:  alert & oriented X3.  Speech normal, gait appropriate for age and unassisted Psych--  Cognition and judgment appear intact.  Cooperative with normal attention span and concentration.  Behavior appropriate. No anxious or depressed appearing.      Assessment & Plan:    Assessment Anxiety - occ sx (son dx w/ bipolar) H/o asthma  DJD- sees MD in [facility] Osteoporosis- sees MD in [place], on reclast Migraines, better after menopause Menopausal state Skin cancer: Melanoma, BCC, SCC ----> Sees dermatology yearly Bee stings allergy-- has a epipen    PLAN: Viral syndrome 61 year old lady with a history of asthma, exposed to the flu, she  Developed respiratory symptoms over last 24 hours. I think is prudent to treat her with Tamiflu 75 mg twice a day along with albuterol and conservative treatment. If she is  developing influenza, this will be the only  opportunity we have to treat her. See instructions

## 2016-07-20 NOTE — Patient Instructions (Signed)
Rest, fluids , tylenol  For cough:  Take Mucinex or mucinex  DM twice a day as needed until better  If nasal congestion: Use OTC Nasocort or Flonase : 2 nasal sprays on each side of the nose in the morning until you feel better   Avoid decongestants such as  Pseudoephedrine or phenylephrine   If you are wheezing, have chest congestion or severe cough: Use Ventolin   Take TAMIFLU twice a day x 5 days   Call if not gradually better over the next  10 days  Call anytime if the symptoms are severe, or go to a Urgent care

## 2016-07-20 NOTE — Assessment & Plan Note (Signed)
Viral syndrome 61 year old lady with a history of asthma, exposed to the flu, she  Developed respiratory symptoms over last 24 hours. I think is prudent to treat her with Tamiflu 75 mg twice a day along with albuterol and conservative treatment. If she is  developing influenza, this will be the only  opportunity we have to treat her. See instructions

## 2016-07-20 NOTE — Progress Notes (Signed)
Pre visit review using our clinic review tool, if applicable. No additional management support is needed unless otherwise documented below in the visit note. 

## 2016-09-30 DIAGNOSIS — Z01419 Encounter for gynecological examination (general) (routine) without abnormal findings: Secondary | ICD-10-CM | POA: Diagnosis not present

## 2016-09-30 DIAGNOSIS — Z6822 Body mass index (BMI) 22.0-22.9, adult: Secondary | ICD-10-CM | POA: Diagnosis not present

## 2016-10-20 DIAGNOSIS — E559 Vitamin D deficiency, unspecified: Secondary | ICD-10-CM | POA: Diagnosis not present

## 2016-10-20 DIAGNOSIS — M810 Age-related osteoporosis without current pathological fracture: Secondary | ICD-10-CM | POA: Diagnosis not present

## 2016-10-20 DIAGNOSIS — M1712 Unilateral primary osteoarthritis, left knee: Secondary | ICD-10-CM | POA: Diagnosis not present

## 2016-10-20 DIAGNOSIS — Z791 Long term (current) use of non-steroidal anti-inflammatories (NSAID): Secondary | ICD-10-CM | POA: Diagnosis not present

## 2016-10-20 LAB — HEPATIC FUNCTION PANEL
ALT: 21 U/L (ref 7–35)
AST: 21 U/L (ref 13–35)
Alkaline Phosphatase: 81 U/L (ref 25–125)
Bilirubin, Total: 0.4 mg/dL

## 2016-10-20 LAB — BASIC METABOLIC PANEL
BUN: 21 mg/dL (ref 4–21)
Creatinine: 0.8 mg/dL (ref 0.5–1.1)
GLUCOSE: 85 mg/dL
POTASSIUM: 4.3 mmol/L (ref 3.4–5.3)
SODIUM: 140 mmol/L (ref 137–147)

## 2016-10-20 LAB — VITAMIN D 25 HYDROXY (VIT D DEFICIENCY, FRACTURES): Vit D, 25-Hydroxy: 60.6

## 2016-10-20 LAB — HM DEXA SCAN

## 2016-10-28 ENCOUNTER — Encounter: Payer: Self-pay | Admitting: Internal Medicine

## 2016-11-16 ENCOUNTER — Encounter: Payer: Self-pay | Admitting: Internal Medicine

## 2016-12-28 ENCOUNTER — Other Ambulatory Visit: Payer: Self-pay

## 2016-12-29 ENCOUNTER — Ambulatory Visit (INDEPENDENT_AMBULATORY_CARE_PROVIDER_SITE_OTHER): Payer: BLUE CROSS/BLUE SHIELD | Admitting: Internal Medicine

## 2016-12-29 ENCOUNTER — Encounter: Payer: Self-pay | Admitting: Internal Medicine

## 2016-12-29 VITALS — BP 120/66 | HR 75 | Temp 98.1°F | Ht 62.0 in | Wt 126.0 lb

## 2016-12-29 DIAGNOSIS — Z0000 Encounter for general adult medical examination without abnormal findings: Secondary | ICD-10-CM | POA: Diagnosis not present

## 2016-12-29 DIAGNOSIS — Z1159 Encounter for screening for other viral diseases: Secondary | ICD-10-CM | POA: Diagnosis not present

## 2016-12-29 LAB — CBC WITH DIFFERENTIAL/PLATELET
BASOS PCT: 0.5 % (ref 0.0–3.0)
Basophils Absolute: 0 10*3/uL (ref 0.0–0.1)
EOS ABS: 0.1 10*3/uL (ref 0.0–0.7)
Eosinophils Relative: 2.3 % (ref 0.0–5.0)
HCT: 44.6 % (ref 36.0–46.0)
HEMOGLOBIN: 14.8 g/dL (ref 12.0–15.0)
Lymphocytes Relative: 34.7 % (ref 12.0–46.0)
Lymphs Abs: 2 10*3/uL (ref 0.7–4.0)
MCHC: 33.3 g/dL (ref 30.0–36.0)
MCV: 94.1 fl (ref 78.0–100.0)
MONO ABS: 0.4 10*3/uL (ref 0.1–1.0)
Monocytes Relative: 7.9 % (ref 3.0–12.0)
Neutro Abs: 3.1 10*3/uL (ref 1.4–7.7)
Neutrophils Relative %: 54.6 % (ref 43.0–77.0)
Platelets: 204 10*3/uL (ref 150.0–400.0)
RBC: 4.74 Mil/uL (ref 3.87–5.11)
RDW: 13.5 % (ref 11.5–15.5)
WBC: 5.7 10*3/uL (ref 4.0–10.5)

## 2016-12-29 LAB — LIPID PANEL
Cholesterol: 217 mg/dL — ABNORMAL HIGH (ref 0–200)
HDL: 73.6 mg/dL
LDL Cholesterol: 131 mg/dL — ABNORMAL HIGH (ref 0–99)
NonHDL: 143.53
Total CHOL/HDL Ratio: 3
Triglycerides: 61 mg/dL (ref 0.0–149.0)
VLDL: 12.2 mg/dL (ref 0.0–40.0)

## 2016-12-29 LAB — HEPATITIS C ANTIBODY: HCV Ab: NEGATIVE

## 2016-12-29 LAB — TSH: TSH: 2.29 u[IU]/mL (ref 0.35–4.50)

## 2016-12-29 NOTE — Patient Instructions (Signed)
GO TO THE LAB : Get the blood work     GO TO THE FRONT DESK Schedule your next appointment for a  Physical exam in 1 year 

## 2016-12-29 NOTE — Assessment & Plan Note (Signed)
Anxiety: Mild, on and off, on no treatment. Osteoporosis: s/p Reclast 3, had a DEXA within the last year, T score was stable. Was rec a holyday, they plan recheck a DEXA and consider restart Reclast. Other problems stable. RTC one year

## 2016-12-29 NOTE — Progress Notes (Signed)
Pre visit review using our clinic review tool, if applicable. No additional management support is needed unless otherwise documented below in the visit note. 

## 2016-12-29 NOTE — Assessment & Plan Note (Signed)
-  Td 2015 ; Pnm shot 2016; prevnar 2017; zostavax 02-2016;  shingrex discussed    -CCS: Cscope 2006, repeated cscope 10-2014 no polyps, next per GI -female care Dr Edwyna Ready;  MMG: 06-2015  -Diet and exercise: Follows a healthy diet, is active on-off: Counseled - Labs : FLP, CBC, TSH, hep C

## 2016-12-29 NOTE — Progress Notes (Signed)
Subjective:    Patient ID: Laurie Johns, female    DOB: 1956/03/12, 61 y.o.   MRN: [id number]  DOS:  12/29/2016 Type of visit - description :cpx  Interval history: No major concerns   Review of Systems Several year history of urinary frequency without urgency, dysuria or gross hematuria.  Other than above, a 14 point review of systems is negative    Past Medical History:  Diagnosis Date  . Anxiety   . Basal cell carcinoma in situ of skin of right shoulder   . Cancer ([place]) 10/2011   basal cell carcinoma, chest  . Melanoma ([facility])    left thigh  . Menopausal state   . Migraine    better after menopause   . Osteoporosis   . Post-operative nausea and vomiting   . 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 COLONOSCOPY    . G2P2    . hysterosalpilgogram, polyp removal ?      Social History   Social History  . Marital status: Married    Spouse name: N/A  . Number of children: 2  . Years of education: 16   Occupational History  . photographer    Social History Main Topics  . Smoking status: Never Smoker  . Smokeless tobacco: Never Used  . Alcohol use 4.2 oz/week    7 Glasses of wine per week  . Drug use: No  . Sexual activity: Yes    Partners: Male   Other Topics Concern  . Not on file   Social History Narrative   HSG, [employer].Married '80 Children: 2 sons; '84 and '87; 2 grandchildren.    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    Son w/ Bipolar doing better as off 12-29-16     Family History  Problem Relation Age of Onset  . Dementia Mother   . Hypertension Mother   . Hypothyroidism Mother   . Hyperlipidemia Mother   . Osteoporosis Mother   . Osteoporosis Father   . Hypertension Father   . Osteoporosis Sister   . Hypothyroidism Sister   . Heart disease Brother        dx in his 31s  . Hypertension Brother   . Diabetes Maternal Grandmother   . Polycystic kidney  disease Other   . COPD Neg Hx   . Colon cancer Neg Hx   . Breast cancer Neg Hx      Allergies as of 12/29/2016      Reactions   Bee Venom Anaphylaxis   Codeine    REACTION: causes gi upset   Other Rash   Make-up      Medication List       Accurate as of 12/29/16  1:36 PM. Always use your most recent med list.          aspirin-acetaminophen-caffeine 250-250-65 MG tablet Commonly known as:  EXCEDRIN MIGRAINE Take by mouth as needed for headache.   B-12 (Methylcobalamin) 1000 MCG Subl Place under the tongue every other day.   CALCIUM 600 + D PO Take 1 tablet by mouth 2 (two) times daily.   CENTRUM SILVER PO Take 1 tablet by mouth daily.   EPINEPHrine 0.3 mg/0.3 mL Devi Commonly known as:  EPIPEN 2-PAK Inject 0.3 mLs (0.3 mg total) into the muscle once.   FIBER PO Take 2 tablets by mouth daily.   Fish Oil 1000 MG Caps Take 2  capsules by mouth daily.   glucosamine-chondroitin 500-400 MG tablet Take 1 tablet by mouth every other day.   KS ALLERCLEAR PO Take 1 tablet by mouth daily.   SUMAtriptan 50 MG tablet Commonly known as:  IMITREX Take 1 tablet (50 mg total) by mouth once as needed for migraine.   TURMERIC PO Take by mouth daily.   vitamin C 500 MG tablet Commonly known as:  ASCORBIC ACID Take 500 mg by mouth every other day.          Objective:   Physical Exam BP 120/66 (BP Location: Left Arm, Patient Position: Sitting, Cuff Size: Small)   Pulse 75   Temp 98.1 F (36.7 C) (Oral)   Ht 5\' 2"  (1.575 m)   Wt 126 lb (57.2 kg)   SpO2 100%   BMI 23.05 kg/m   General:   Well developed, well nourished . NAD.  Neck: No  thyromegaly  HEENT:  Normocephalic . Face symmetric, atraumatic Lungs:  CTA B Normal respiratory effort, no intercostal retractions, no accessory muscle use. Heart: RRR,  no murmur.  No pretibial edema bilaterally  Abdomen:  Not distended, soft, non-tender. No rebound or rigidity.   Skin: Exposed areas without rash. Not  pale. Not jaundice Neurologic:  alert & oriented X3.  Speech normal, gait appropriate for age and unassisted Strength symmetric and appropriate for age.  Psych: Cognition and judgment appear intact.  Cooperative with normal attention span and concentration.  Behavior appropriate. No anxious or depressed appearing.    Assessment & Plan:   Assessment  (transfer Dr Linda Hedges 08-2014) Anxiety - occ sx (son dx w/ bipolar) H/o asthma  DJD- sees MD in [facility] Osteoporosis- sees MD in [place], on reclast Migraines, better after menopause Menopausal state Skin cancer: Melanoma, BCC, SCC ----> Sees dermatology yearly Bee stings allergy-- has a epipen   PLAN:  Anxiety: Mild, on and off, on no treatment. Osteoporosis: s/p Reclast 3, had a DEXA within the last year, T score was stable. Was rec a holyday, they plan recheck a DEXA and consider restart Reclast. Other problems stable. RTC one year

## 2017-02-16 DIAGNOSIS — L738 Other specified follicular disorders: Secondary | ICD-10-CM | POA: Diagnosis not present

## 2017-02-16 DIAGNOSIS — L821 Other seborrheic keratosis: Secondary | ICD-10-CM | POA: Diagnosis not present

## 2017-02-16 DIAGNOSIS — D225 Melanocytic nevi of trunk: Secondary | ICD-10-CM | POA: Diagnosis not present

## 2017-02-16 DIAGNOSIS — Z85828 Personal history of other malignant neoplasm of skin: Secondary | ICD-10-CM | POA: Diagnosis not present

## 2017-03-02 DIAGNOSIS — H01001 Unspecified blepharitis right upper eyelid: Secondary | ICD-10-CM | POA: Diagnosis not present

## 2017-03-16 DIAGNOSIS — Z1231 Encounter for screening mammogram for malignant neoplasm of breast: Secondary | ICD-10-CM | POA: Diagnosis not present

## 2017-03-16 LAB — HM MAMMOGRAPHY

## 2017-03-19 ENCOUNTER — Encounter: Payer: Self-pay | Admitting: Internal Medicine

## 2017-06-15 DIAGNOSIS — H43813 Vitreous degeneration, bilateral: Secondary | ICD-10-CM | POA: Diagnosis not present

## 2017-06-15 DIAGNOSIS — H524 Presbyopia: Secondary | ICD-10-CM | POA: Diagnosis not present

## 2017-10-20 DIAGNOSIS — Z01419 Encounter for gynecological examination (general) (routine) without abnormal findings: Secondary | ICD-10-CM | POA: Diagnosis not present

## 2017-10-20 DIAGNOSIS — Z6823 Body mass index (BMI) 23.0-23.9, adult: Secondary | ICD-10-CM | POA: Diagnosis not present

## 2017-10-20 LAB — HM PAP SMEAR

## 2017-12-30 ENCOUNTER — Encounter: Payer: Self-pay | Admitting: Internal Medicine

## 2018-01-04 ENCOUNTER — Encounter: Payer: Self-pay | Admitting: Internal Medicine

## 2018-01-04 ENCOUNTER — Ambulatory Visit (INDEPENDENT_AMBULATORY_CARE_PROVIDER_SITE_OTHER): Payer: BLUE CROSS/BLUE SHIELD | Admitting: Internal Medicine

## 2018-01-04 VITALS — BP 116/68 | HR 73 | Temp 97.8°F | Resp 14 | Ht 62.0 in | Wt 130.1 lb

## 2018-01-04 DIAGNOSIS — Z23 Encounter for immunization: Secondary | ICD-10-CM | POA: Diagnosis not present

## 2018-01-04 DIAGNOSIS — Z0000 Encounter for general adult medical examination without abnormal findings: Secondary | ICD-10-CM

## 2018-01-04 LAB — LIPID PANEL
Cholesterol: 244 mg/dL — ABNORMAL HIGH (ref 0–200)
HDL: 73.7 mg/dL (ref >39.00)
LDL CALC: 153 mg/dL — AB (ref 0–99)
NONHDL: 170.43
Total CHOL/HDL Ratio: 3
Triglycerides: 88 mg/dL (ref 0.0–149.0)
VLDL: 17.6 mg/dL (ref 0.0–40.0)

## 2018-01-04 LAB — COMPREHENSIVE METABOLIC PANEL
ALT: 14 U/L (ref 0–35)
AST: 16 U/L (ref 0–37)
Albumin: 4.4 g/dL (ref 3.5–5.2)
Alkaline Phosphatase: 59 U/L (ref 39–117)
BUN: 19 mg/dL (ref 6–23)
CHLORIDE: 102 meq/L (ref 96–112)
CO2: 28 mEq/L (ref 19–32)
CREATININE: 0.82 mg/dL (ref 0.40–1.20)
Calcium: 9.2 mg/dL (ref 8.4–10.5)
GFR: 74.58 mL/min (ref >60.00)
GLUCOSE: 95 mg/dL (ref 70–99)
POTASSIUM: 4.3 meq/L (ref 3.5–5.1)
SODIUM: 138 meq/L (ref 135–145)
TOTAL PROTEIN: 6.8 g/dL (ref 6.0–8.3)
Total Bilirubin: 0.5 mg/dL (ref 0.2–1.2)

## 2018-01-04 LAB — CBC WITH DIFFERENTIAL/PLATELET
BASOS ABS: 0 10*3/uL (ref 0.0–0.1)
Basophils Relative: 0.4 % (ref 0.0–3.0)
EOS ABS: 0.1 10*3/uL (ref 0.0–0.7)
Eosinophils Relative: 2.2 % (ref 0.0–5.0)
HCT: 42.4 % (ref 36.0–46.0)
HEMOGLOBIN: 14.2 g/dL (ref 12.0–15.0)
Lymphocytes Relative: 37.8 % (ref 12.0–46.0)
Lymphs Abs: 1.9 10*3/uL (ref 0.7–4.0)
MCHC: 33.4 g/dL (ref 30.0–36.0)
MCV: 92.5 fl (ref 78.0–100.0)
Monocytes Absolute: 0.5 10*3/uL (ref 0.1–1.0)
Monocytes Relative: 9.1 % (ref 3.0–12.0)
Neutro Abs: 2.5 10*3/uL (ref 1.4–7.7)
Neutrophils Relative %: 50.5 % (ref 43.0–77.0)
Platelets: 195 10*3/uL (ref 150.0–400.0)
RBC: 4.59 Mil/uL (ref 3.87–5.11)
RDW: 13.4 % (ref 11.5–15.5)
WBC: 5 10*3/uL (ref 4.0–10.5)

## 2018-01-04 NOTE — Assessment & Plan Note (Addendum)
-  Td 2015 ; Pnm shot 2016; prevnar 2017; zostavax 02-2016 Shingrex discussed, likes to proceed, knows is expensive, offered to check with her insurance first and get the shot another day but declined.shingrex #1 today,   RTC 2-3 months for a booster -CCS: Cscope 2006, repeated cscope 10-2014 no polyps, next per GI -female care Dr Edwyna Ready;  MMG: 03-2017; Negative Pap smear 10/20/2017 -Diet and exercise: Discussed. -labs: CMP, FLP, CBC

## 2018-01-04 NOTE — Progress Notes (Signed)
Subjective:    Patient ID: Laurie Johns, female    DOB: 1956-05-07, 63 y.o.   MRN: [id number]  DOS:  01/04/2018 Type of visit - description : cpx Interval history: No major problems since last visit a year ago  Review of Systems Has a family history of gastritis, pt is concerned but  denies nausea, vomiting, diarrhea, change in the color of the stools, heartburn. Occasional belching. Concerned about her memory, she is highly functional, no obvious deficits noted, she is somewhat forgetful.  Other than above, a 14 point review of systems is negative    Past Medical History:  Diagnosis Date  . Anxiety   . Basal cell carcinoma in situ of skin of right shoulder   . Cancer ([place]) 10/2011   basal cell carcinoma, chest  . Melanoma (HCC)    left thigh  . Menopausal state   . Migraine    better after menopause   . Osteoporosis   . Post-operative nausea and vomiting   . 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 COLONOSCOPY    . G2P2    . hysterosalpilgogram, polyp removal ?      Social History   Socioeconomic History  . Marital status: Married    Spouse name: Not on file  . Number of children: 2  . Years of education: 91  . Highest education level: Not on file  Occupational History  . Occupation: photographer,retired   Social Needs  . Financial resource strain: Not on file  . Food insecurity:    Worry: Not on file    Inability: Not on file  . Transportation needs:    Medical: Not on file    Non-medical: Not on file  Tobacco Use  . Smoking status: Never Smoker  . Smokeless tobacco: Never Used  Substance and Sexual Activity  . Alcohol use: Yes    Alcohol/week: 4.2 oz    Types: 7 Glasses of wine per week  . Drug use: No  . Sexual activity: Yes    Partners: Male  Lifestyle  . Physical activity:    Days per week: Not on file    Minutes per session: Not on file  . Stress: Not on file    Relationships  . Social connections:    Talks on phone: Not on file    Gets together: Not on file    Attends religious service: Not on file    Active member of club or organization: Not on file    Attends meetings of clubs or organizations: Not on file    Relationship status: Not on file  . Intimate partner violence:    Fear of current or ex partner: Not on file    Emotionally abused: Not on file    Physically abused: Not on file    Forced sexual activity: Not on file  Other Topics Concern  . Not on file  Social History Narrative   Household- pt and husband   HSG, [employer].Married '80 Children: 2 sons; '84 and '87; 2 grandchildren.    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    Son w/ Bipolar doing ok as off 12/2017         Family History  Problem Relation Age of Onset  . Dementia Mother   . Hypertension Mother   . Hypothyroidism Mother   . Hyperlipidemia Mother   . Osteoporosis Mother   .  Osteoporosis Father   . Hypertension Father   . Osteoporosis Sister   . Hypothyroidism Sister   . Heart disease Brother        dx in his 54s  . Hypertension Brother   . Diabetes Maternal Grandmother   . Polycystic kidney disease Other   . COPD Neg Hx   . Colon cancer Neg Hx   . Breast cancer Neg Hx      Allergies as of 01/04/2018      Reactions   Bee Venom Anaphylaxis   Codeine    REACTION: causes gi upset   Other Rash   Make-up      Medication List        Accurate as of 01/04/18 11:59 PM. Always use your most recent med list.          aspirin-acetaminophen-caffeine 250-250-65 MG tablet Commonly known as:  EXCEDRIN MIGRAINE Take by mouth as needed for headache.   B-12 (Methylcobalamin) 1000 MCG Subl Place under the tongue every other day.   CALCIUM 600 + D PO Take 1 tablet by mouth 2 (two) times daily.   CENTRUM SILVER PO Take 1 tablet by mouth daily.   EPINEPHrine 0.3 mg/0.3 mL Devi Commonly known as:  EPIPEN 2-PAK Inject 0.3 mLs (0.3 mg total)  into the muscle once.   FIBER PO Take 2 tablets by mouth daily.   Fish Oil 1000 MG Caps Take 2 capsules by mouth daily.   glucosamine-chondroitin 500-400 MG tablet Take 1 tablet by mouth every other day.   KS ALLERCLEAR PO Take 1 tablet by mouth daily.   SUMAtriptan 50 MG tablet Commonly known as:  IMITREX Take 1 tablet (50 mg total) by mouth once as needed for migraine.   TURMERIC PO Take by mouth daily.   vitamin C 500 MG tablet Commonly known as:  ASCORBIC ACID Take 500 mg by mouth every other day.          Objective:   Physical Exam BP 116/68 (BP Location: Left Arm, Patient Position: Sitting, Cuff Size: Small)   Pulse 73   Temp 97.8 F (36.6 C) (Oral)   Resp 14   Ht 5\' 2"  (1.575 m)   Wt 130 lb 2 oz (59 kg)   SpO2 97%   BMI 23.80 kg/m   General: Well developed, NAD, see BMI.  Neck: No  thyromegaly  HEENT:  Normocephalic . Face symmetric, atraumatic Lungs:  CTA B Normal respiratory effort, no intercostal retractions, no accessory muscle use. Heart: RRR,  no murmur.  No pretibial edema bilaterally  Abdomen:  Not distended, soft, non-tender. No rebound or rigidity.   Skin: Exposed areas without rash. Not pale. Not jaundice Neurologic:  alert & oriented X3.  Speech normal, gait appropriate for age and unassisted Strength symmetric and appropriate for age.  Psych: Cognition and judgment appear intact.  Cooperative with normal attention span and concentration.  Behavior appropriate. No anxious or depressed appearing.     Assessment & Plan:   Assessment  (transfer Dr Linda Hedges 08-2014) Anxiety - occ sx (son dx w/ bipolar) H/o asthma  DJD- sees MD in [facility] Osteoporosis- sees MD in [place], on reclast Migraines, better after menopause Menopausal state Skin cancer: Melanoma, BCC, SCC ----> Sees dermatology yearly Bee stings allergy-- has a epipen   PLAN:  Anxiety: Mild, on and off, on no treatment. Osteoporosis: s/p Reclast 3, on a holiday;  DEXA 10/20/2016: T score -2.9. F/u elsewhere.  They are consider additional treatment such as Forteo.  Memory issues?  She is doing well, somewhat forgetful but no evidence of dementia.  Recommend to stay engage mentally, increase physical activity. FH gastritis, patient concerned: Warning symptoms discussed.  She is doing well. RTC 1 year

## 2018-01-04 NOTE — Patient Instructions (Signed)
GO TO THE LAB : Get the blood work     GO TO THE FRONT DESK Schedule your next appointment for a Shingrix booster in 2 or 3 months  Schedule your next physical exam for 12-2018

## 2018-01-04 NOTE — Progress Notes (Signed)
Pre visit review using our clinic review tool, if applicable. No additional management support is needed unless otherwise documented below in the visit note. 

## 2018-01-05 NOTE — Assessment & Plan Note (Signed)
PLAN:  Anxiety: Mild, on and off, on no treatment. Osteoporosis: s/p Reclast 3, on a holiday; DEXA 10/20/2016: T score -2.9. F/u elsewhere.  They are consider additional treatment such as Forteo. Memory issues?  She is doing well, somewhat forgetful but no evidence of dementia.  Recommend to stay engage mentally, increase physical activity. FH gastritis, patient concerned: Warning symptoms discussed.  She is doing well. RTC 1 year

## 2018-01-27 ENCOUNTER — Encounter: Payer: Self-pay | Admitting: Internal Medicine

## 2018-01-28 NOTE — Telephone Encounter (Signed)
I don't see anything in health maintenance flagging that she needs a diabetes check? Patient does not have diabetes. please advise.

## 2018-01-31 DIAGNOSIS — E559 Vitamin D deficiency, unspecified: Secondary | ICD-10-CM | POA: Diagnosis not present

## 2018-01-31 DIAGNOSIS — M1712 Unilateral primary osteoarthritis, left knee: Secondary | ICD-10-CM | POA: Diagnosis not present

## 2018-01-31 DIAGNOSIS — M810 Age-related osteoporosis without current pathological fracture: Secondary | ICD-10-CM | POA: Diagnosis not present

## 2018-02-22 DIAGNOSIS — D2271 Melanocytic nevi of right lower limb, including hip: Secondary | ICD-10-CM | POA: Diagnosis not present

## 2018-02-22 DIAGNOSIS — Z85828 Personal history of other malignant neoplasm of skin: Secondary | ICD-10-CM | POA: Diagnosis not present

## 2018-02-22 DIAGNOSIS — L812 Freckles: Secondary | ICD-10-CM | POA: Diagnosis not present

## 2018-02-22 DIAGNOSIS — L821 Other seborrheic keratosis: Secondary | ICD-10-CM | POA: Diagnosis not present

## 2018-03-22 DIAGNOSIS — Z1231 Encounter for screening mammogram for malignant neoplasm of breast: Secondary | ICD-10-CM | POA: Diagnosis not present

## 2018-03-22 LAB — HM MAMMOGRAPHY

## 2018-03-30 ENCOUNTER — Encounter: Payer: Self-pay | Admitting: Internal Medicine

## 2018-04-15 ENCOUNTER — Ambulatory Visit (INDEPENDENT_AMBULATORY_CARE_PROVIDER_SITE_OTHER): Payer: BLUE CROSS/BLUE SHIELD

## 2018-04-15 DIAGNOSIS — Z23 Encounter for immunization: Secondary | ICD-10-CM | POA: Diagnosis not present

## 2018-04-19 ENCOUNTER — Ambulatory Visit (INDEPENDENT_AMBULATORY_CARE_PROVIDER_SITE_OTHER): Payer: BLUE CROSS/BLUE SHIELD

## 2018-04-19 DIAGNOSIS — Z23 Encounter for immunization: Secondary | ICD-10-CM

## 2019-01-03 ENCOUNTER — Other Ambulatory Visit: Payer: Self-pay

## 2019-01-03 ENCOUNTER — Ambulatory Visit (INDEPENDENT_AMBULATORY_CARE_PROVIDER_SITE_OTHER): Payer: Medicare Other | Admitting: Internal Medicine

## 2019-01-03 DIAGNOSIS — R197 Diarrhea, unspecified: Secondary | ICD-10-CM

## 2019-01-03 DIAGNOSIS — R194 Change in bowel habit: Secondary | ICD-10-CM | POA: Diagnosis not present

## 2019-01-03 DIAGNOSIS — Z20822 Contact with and (suspected) exposure to covid-19: Secondary | ICD-10-CM

## 2019-01-03 NOTE — Assessment & Plan Note (Signed)
Change in bowel habits: The patient is 63 year old, last colonoscopy was 10/2014, bowel movement pattern has change from loose stools to now diarrhea and increasing frequency and some urgency. Plan: GI referral She is coming to the office soon for a checkup, planning to do labs and possibly stool studies. She is going camping in few days, for the short-term is okay to take Pepto-Bismol or Imodium as needed. COVID-19 screening: Request screening, she is going camping out of the state.  Will do. RTC as a schedule 01/10/2019

## 2019-01-03 NOTE — Progress Notes (Signed)
Subjective:    Patient ID: Laurie Johns, female    DOB: Jun 30, 1955, 63 y.o.   MRN: [id number]  DOS:  01/03/2019 Type of visit - description: Virtual Visit via Video Note  I connected with@   by a video enabled telemedicine application and verified that I am speaking with the correct person using two identifiers.   THIS ENCOUNTER IS A VIRTUAL VISIT DUE TO COVID-19 - PATIENT WAS NOT SEEN IN THE OFFICE. PATIENT HAS CONSENTED TO VIRTUAL VISIT / TELEMEDICINE VISIT   Location of patient: home  Location of provider: office  I discussed the limitations of evaluation and management by telemedicine and the availability of in person appointments. The patient expressed understanding and agreed to proceed.  History of Present Illness: Acute visit For the last year, she has noticed her stool to be loose. For the last month, her stools are much looser to the point that often times she has watery diarrhea associated with gas. Typically has 1 or 2 BMs early in the morning every day but lately there are times that she has 3 BMs. She has some Pepto-Bismol and Imodium at home but does not like to use long-term.   Review of Systems Denies fever chills.  No weight loss No nausea or vomiting No blood in the stools Appetite is okay Not taking any new medications or supplements. Admits to some increased stress related to family matters. No recent antibiotics. Not feeling weak or dizzy.  Past Medical History:  Diagnosis Date  . Anxiety   . Basal cell carcinoma in situ of skin of right shoulder   . Cancer ([place]) 10/2011   basal cell carcinoma, chest  . Melanoma (HCC)    left thigh  . Menopausal state   . Migraine    better after menopause   . Osteoporosis   . Post-operative nausea and vomiting   . 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 COLONOSCOPY    . G2P2    . hysterosalpilgogram, polyp removal ?      Social  History   Socioeconomic History  . Marital status: Married    Spouse name: Not on file  . Number of children: 2  . Years of education: 70  . Highest education level: Not on file  Occupational History  . Occupation: photographer,retired   Social Needs  . Financial resource strain: Not on file  . Food insecurity    Worry: Not on file    Inability: Not on file  . Transportation needs    Medical: Not on file    Non-medical: Not on file  Tobacco Use  . Smoking status: Never Smoker  . Smokeless tobacco: Never Used  Substance and Sexual Activity  . Alcohol use: Yes    Alcohol/week: 7.0 standard drinks    Types: 7 Glasses of wine per week  . Drug use: No  . Sexual activity: Yes    Partners: Male  Lifestyle  . Physical activity    Days per week: Not on file    Minutes per session: Not on file  . Stress: Not on file  Relationships  . Social Herbalist on phone: Not on file    Gets together: Not on file    Attends religious service: Not on file    Active member of club or organization: Not on file    Attends meetings of clubs or organizations:  Not on file    Relationship status: Not on file  . Intimate partner violence    Fear of current or ex partner: Not on file    Emotionally abused: Not on file    Physically abused: Not on file    Forced sexual activity: Not on file  Other Topics Concern  . Not on file  Social History Narrative   Household- pt and husband   HSG, [employer].Married '80 Children: 2 sons; '84 and '87; 2 grandchildren.    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    Son w/ Bipolar doing ok as off 12/2017          Allergies as of 01/03/2019      Reactions   Bee Venom Anaphylaxis   Codeine    REACTION: causes gi upset   Other Rash   Make-up      Medication List       Accurate as of January 03, 2019  5:37 PM. If you have any questions, ask your nurse or doctor.        aspirin-acetaminophen-caffeine 244-010-27 MG tablet Commonly  known as: EXCEDRIN MIGRAINE Take by mouth as needed for headache.   B-12 (Methylcobalamin) 1000 MCG Subl Place under the tongue every other day.   CALCIUM 600 + D PO Take 1 tablet by mouth 2 (two) times daily.   CENTRUM SILVER PO Take 1 tablet by mouth daily.   EPINEPHrine 0.3 mg/0.3 mL Devi Commonly known as: EpiPen 2-Pak Inject 0.3 mLs (0.3 mg total) into the muscle once.   FIBER PO Take 2 tablets by mouth daily.   Fish Oil 1000 MG Caps Take 2 capsules by mouth daily.   glucosamine-chondroitin 500-400 MG tablet Take 1 tablet by mouth every other day.   KS ALLERCLEAR PO Take 1 tablet by mouth daily.   SUMAtriptan 50 MG tablet Commonly known as: Imitrex Take 1 tablet (50 mg total) by mouth once as needed for migraine.   TURMERIC PO Take by mouth daily.   vitamin C 500 MG tablet Commonly known as: ASCORBIC ACID Take 500 mg by mouth every other day.           Objective:   Physical Exam There were no vitals taken for this visit. This is a virtual video visit.  She looks well, alert oriented x3    Assessment     Assessment  (transfer Dr Linda Hedges 08-2014) Anxiety - occ sx (son dx w/ bipolar) H/o asthma  DJD- sees MD in [facility] Osteoporosis- sees MD in [place], on reclast Migraines, better after menopause Menopausal state Skin cancer: Melanoma, BCC, SCC ----> Sees dermatology yearly Bee stings allergy-- has a epipen   PLAN:  Change in bowel habits: The patient is 63 year old, last colonoscopy was 10/2014, bowel movement pattern has change from loose stools to now diarrhea and increasing frequency and some urgency. Plan: GI referral She is coming to the office soon for a checkup, planning to do labs and possibly stool studies. She is going camping in few days, for the short-term is okay to take Pepto-Bismol or Imodium as needed. COVID-19 screening: Request screening, she is going camping out of the state.  Will do. RTC as a schedule 01/10/2019      I  discussed the assessment and treatment plan with the patient. The patient was provided an opportunity to ask questions and all were answered. The patient agreed with the plan and demonstrated an understanding of the instructions.   The patient was advised  to call back or seek an in-person evaluation if the symptoms worsen or if the condition fails to improve as anticipated.

## 2019-01-04 NOTE — Addendum Note (Signed)
Addended byDamita Dunnings D on: 01/04/2019 07:41 AM   Modules accepted: Orders

## 2019-01-09 ENCOUNTER — Other Ambulatory Visit: Payer: Self-pay

## 2019-01-09 DIAGNOSIS — Z20822 Contact with and (suspected) exposure to covid-19: Secondary | ICD-10-CM

## 2019-01-10 ENCOUNTER — Encounter: Payer: BLUE CROSS/BLUE SHIELD | Admitting: Internal Medicine

## 2019-01-10 LAB — NOVEL CORONAVIRUS, NAA: SARS-CoV-2, NAA: NOT DETECTED

## 2019-01-30 ENCOUNTER — Other Ambulatory Visit: Payer: Self-pay

## 2019-01-31 ENCOUNTER — Ambulatory Visit (INDEPENDENT_AMBULATORY_CARE_PROVIDER_SITE_OTHER): Payer: Medicare Other | Admitting: Internal Medicine

## 2019-01-31 ENCOUNTER — Encounter: Payer: Self-pay | Admitting: Internal Medicine

## 2019-01-31 VITALS — BP 110/66 | HR 71 | Temp 97.4°F | Resp 16 | Ht 62.0 in | Wt 129.5 lb

## 2019-01-31 DIAGNOSIS — Z13220 Encounter for screening for lipoid disorders: Secondary | ICD-10-CM

## 2019-01-31 DIAGNOSIS — R194 Change in bowel habit: Secondary | ICD-10-CM | POA: Diagnosis not present

## 2019-01-31 DIAGNOSIS — Z23 Encounter for immunization: Secondary | ICD-10-CM | POA: Diagnosis not present

## 2019-01-31 DIAGNOSIS — F419 Anxiety disorder, unspecified: Secondary | ICD-10-CM

## 2019-01-31 DIAGNOSIS — M810 Age-related osteoporosis without current pathological fracture: Secondary | ICD-10-CM

## 2019-01-31 MED ORDER — EPINEPHRINE 0.3 MG/0.3ML IJ SOAJ: 0.3000 mg | Freq: Once | INTRAMUSCULAR | 1 refills | Status: DC | PRN

## 2019-01-31 NOTE — Assessment & Plan Note (Signed)
-  Td 2015 - Pnm shot 2016 -prevnar 2017 -zostavax 02-2016 -S/p shingrex x 2 -- high dose flu shot today -CCS: Cscope 2006, repeated cscope 10-2014 no polyps, 10 years per report -female care Dr Edwyna Ready;  MMG: 03-2018; Negative Pap smear 10/20/2017

## 2019-01-31 NOTE — Patient Instructions (Signed)
GO TO THE LAB : Get the blood work, get a container and bring the stool samples when possible   GO TO THE FRONT DESK Schedule your next appointment   checkup in 1 year

## 2019-01-31 NOTE — Progress Notes (Signed)
Pre visit review using our clinic review tool, if applicable. No additional management support is needed unless otherwise documented below in the visit note. 

## 2019-01-31 NOTE — Progress Notes (Signed)
Subjective:    Patient ID: Laurie Johns, female    DOB: 01-07-1956, 63 y.o.   MRN: [id number]  DOS:  01/31/2019 Type of visit - description: Routine office visit Migraines: Well-controlled, hardly ever uses her medications Anxiety: Increased stress lately due to his son's health and quarantine Change in bowels habits: Ongoing issue. Again she has loose stools, 2-3 bowel movements early in the morning, sometimes has a bowel movement after meals associated with some cramps. No antibiotics recently.  Review of Systems Denies fever chills or weight loss No chest pain no difficulty breathing No nausea vomiting or blood in the stools No mucus in the stools   Past Medical History:  Diagnosis Date  . Anxiety   . Basal cell carcinoma in situ of skin of right shoulder   . Cancer ([place]) 10/2011   basal cell carcinoma, chest  . Melanoma (HCC)    left thigh  . Menopausal state   . Migraine    better after menopause   . Osteoporosis   . Post-operative nausea and vomiting   . 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 COLONOSCOPY    . G2P2    . hysterosalpilgogram, polyp removal ?      Social History   Socioeconomic History  . Marital status: Married    Spouse name: Not on file  . Number of children: 2  . Years of education: 68  . Highest education level: Not on file  Occupational History  . Occupation: photographer,retired   Social Needs  . Financial resource strain: Not on file  . Food insecurity    Worry: Not on file    Inability: Not on file  . Transportation needs    Medical: Not on file    Non-medical: Not on file  Tobacco Use  . Smoking status: Never Smoker  . Smokeless tobacco: Never Used  Substance and Sexual Activity  . Alcohol use: Yes    Alcohol/week: 7.0 standard drinks    Types: 7 Glasses of wine per week  . Drug use: No  . Sexual activity: Yes    Partners: Male  Lifestyle  .  Physical activity    Days per week: Not on file    Minutes per session: Not on file  . Stress: Not on file  Relationships  . Social Herbalist on phone: Not on file    Gets together: Not on file    Attends religious service: Not on file    Active member of club or organization: Not on file    Attends meetings of clubs or organizations: Not on file    Relationship status: Not on file  . Intimate partner violence    Fear of current or ex partner: Not on file    Emotionally abused: Not on file    Physically abused: Not on file    Forced sexual activity: Not on file  Other Topics Concern  . Not on file  Social History Narrative   Household- pt and husband   HSG, [employer].Married '80 Children: 2 sons; '84 and '87; 2 grandchildren.    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    Son w/ Bipolar doing ok as off 12/2017          Allergies as of 01/31/2019      Reactions   Bee Venom Anaphylaxis   Codeine    REACTION: causes  gi upset   Other Rash   Make-up      Medication List       Accurate as of January 31, 2019 11:59 PM. If you have any questions, ask your nurse or doctor.        STOP taking these medications   B-12 (Methylcobalamin) 1000 MCG Subl Stopped by: Kathlene November, MD   EPINEPHrine 0.3 mg/0.3 mL Devi Commonly known as: EpiPen 2-Pak Replaced by: EPINEPHrine 0.3 mg/0.3 mL Soaj injection Stopped by: Kathlene November, MD   vitamin C 500 MG tablet Commonly known as: ASCORBIC ACID Stopped by: Kathlene November, MD     TAKE these medications   aspirin-acetaminophen-caffeine [phone] MG tablet Commonly known as: EXCEDRIN MIGRAINE Take by mouth as needed for headache.   CALCIUM 600 + D PO Take 1 tablet by mouth 2 (two) times daily.   CENTRUM SILVER PO Take 1 tablet by mouth daily.   EPINEPHrine 0.3 mg/0.3 mL Soaj injection Commonly known as: EpiPen 2-Pak Inject 0.3 mLs (0.3 mg total) into the muscle Once PRN for up to 1 dose for anaphylaxis. Replaces:  EPINEPHrine 0.3 mg/0.3 mL Kerrin Mo Started by: Kathlene November, MD   FIBER PO Take 2 tablets by mouth daily.   Fish Oil 1000 MG Caps Take 2 capsules by mouth daily.   glucosamine-chondroitin 500-400 MG tablet Take 1 tablet by mouth every other day.   Imodium A-D 2 MG capsule Generic drug: loperamide   KS ALLERCLEAR PO Take 1 tablet by mouth daily.   Melatonin 10 MG Caps   PEPTO-BISMOL MAX STRENGTH PO   SUMAtriptan 50 MG tablet Commonly known as: Imitrex Take 1 tablet (50 mg total) by mouth once as needed for migraine.   TURMERIC PO Take by mouth daily.           Objective:   Physical Exam BP 110/66 (BP Location: Left Arm, Patient Position: Sitting, Cuff Size: Small)   Pulse 71   Temp (!) 97.4 F (36.3 C) (Temporal)   Resp 16   Ht 5\' 2"  (1.575 m)   Wt 129 lb 8 oz (58.7 kg)   SpO2 100%   BMI 23.69 kg/m  General: Well developed, NAD, BMI noted Neck: No  thyromegaly  HEENT:  Normocephalic . Face symmetric, atraumatic Lungs:  CTA B Normal respiratory effort, no intercostal retractions, no accessory muscle use. Heart: RRR,  no murmur.  No pretibial edema bilaterally  Abdomen:  Not distended, soft, non-tender. No rebound or rigidity.   Skin: Exposed areas without rash. Not pale. Not jaundice Neurologic:  alert & oriented X3.  Speech normal, gait appropriate for age and unassisted Strength symmetric and appropriate for age.  Psych: Cognition and judgment appear intact.  Cooperative with normal attention span and concentration.  Behavior appropriate. No anxious or depressed appearing.     Assessment     Assessment  (transfer Dr Linda Hedges 08-2014) Anxiety - occ sx (son dx w/ bipolar) H/o asthma  DJD- sees MD in [facility] Osteoporosis- sees MD in [place], on reclast Migraines, better after menopause Menopausal state Skin cancer: Melanoma, BCC, SCC ----> Sees dermatology yearly Bee stings allergy-- has a epipen   PLAN: Anxiety: Related to his son's bipolar  condition and quarantine.  Counseled,  declines medications. DJD, osteoporosis: Managed elsewhere . Migraines: Hardly ever has any problems, takes medicines as needed Bee stings allergies: Refill EpiPen.  Previously RF by gynecology Change in bowel habits: As described above, going on for a year.  Needs further investigation.  Will check  CMP, CBC, TSH.  Stool sample for GI pathogens, she was already referred to GI, ? Colonoscopy. Preventive care reviewed RTC 1 year

## 2019-02-01 ENCOUNTER — Other Ambulatory Visit: Payer: Self-pay

## 2019-02-01 ENCOUNTER — Other Ambulatory Visit: Payer: Medicare Other

## 2019-02-01 DIAGNOSIS — R194 Change in bowel habit: Secondary | ICD-10-CM

## 2019-02-01 LAB — CBC WITH DIFFERENTIAL/PLATELET
Basophils Absolute: 0.1 10*3/uL (ref 0.0–0.1)
Basophils Relative: 0.8 % (ref 0.0–3.0)
Eosinophils Absolute: 0.2 10*3/uL (ref 0.0–0.7)
Eosinophils Relative: 2.3 % (ref 0.0–5.0)
HCT: 42.8 % (ref 36.0–46.0)
Hemoglobin: 14.2 g/dL (ref 12.0–15.0)
Lymphocytes Relative: 32.6 % (ref 12.0–46.0)
Lymphs Abs: 2.3 10*3/uL (ref 0.7–4.0)
MCHC: 33.1 g/dL (ref 30.0–36.0)
MCV: 93.5 fl (ref 78.0–100.0)
Monocytes Absolute: 0.6 10*3/uL (ref 0.1–1.0)
Monocytes Relative: 9.2 % (ref 3.0–12.0)
Neutro Abs: 3.8 10*3/uL (ref 1.4–7.7)
Neutrophils Relative %: 55.1 % (ref 43.0–77.0)
Platelets: 215 10*3/uL (ref 150.0–400.0)
RBC: 4.58 Mil/uL (ref 3.87–5.11)
RDW: 13.6 % (ref 11.5–15.5)
WBC: 6.9 10*3/uL (ref 4.0–10.5)

## 2019-02-01 LAB — COMPREHENSIVE METABOLIC PANEL
ALT: 16 U/L (ref 0–35)
AST: 19 U/L (ref 0–37)
Albumin: 4.5 g/dL (ref 3.5–5.2)
Alkaline Phosphatase: 59 U/L (ref 39–117)
BUN: 24 mg/dL — ABNORMAL HIGH (ref 6–23)
CO2: 27 mEq/L (ref 19–32)
Calcium: 9.6 mg/dL (ref 8.4–10.5)
Chloride: 102 mEq/L (ref 96–112)
Creatinine, Ser: 0.86 mg/dL (ref 0.40–1.20)
GFR: 66.19 mL/min (ref >60.00)
Glucose, Bld: 85 mg/dL (ref 70–99)
Potassium: 4.1 mEq/L (ref 3.5–5.1)
Sodium: 139 mEq/L (ref 135–145)
Total Bilirubin: 0.5 mg/dL (ref 0.2–1.2)
Total Protein: 6.9 g/dL (ref 6.0–8.3)

## 2019-02-01 LAB — LIPID PANEL
Cholesterol: 247 mg/dL — ABNORMAL HIGH (ref 0–200)
HDL: 72.6 mg/dL (ref >39.00)
LDL Cholesterol: 161 mg/dL — ABNORMAL HIGH (ref 0–99)
NonHDL: 174.63
Total CHOL/HDL Ratio: 3
Triglycerides: 69 mg/dL (ref 0.0–149.0)
VLDL: 13.8 mg/dL (ref 0.0–40.0)

## 2019-02-01 LAB — TSH: TSH: 2.91 u[IU]/mL (ref 0.35–4.50)

## 2019-02-01 NOTE — Assessment & Plan Note (Signed)
Anxiety: Related to his son's bipolar condition and quarantine.  Counseled,  declines medications. DJD, osteoporosis: Managed elsewhere . Migraines: Hardly ever has any problems, takes medicines as needed Bee stings allergies: Refill EpiPen.  Previously RF by gynecology Change in bowel habits: As described above, going on for a year.  Needs further investigation.  Will check CMP, CBC, TSH.  Stool sample for GI pathogens, she was already referred to GI, ? Colonoscopy. Preventive care reviewed RTC 1 year

## 2019-02-06 LAB — GASTROINTESTINAL PATHOGEN PANEL PCR
C. difficile Tox A/B, PCR: NOT DETECTED
Campylobacter, PCR: NOT DETECTED
Cryptosporidium, PCR: NOT DETECTED
E coli (ETEC) LT/ST PCR: NOT DETECTED
E coli (STEC) stx1/stx2, PCR: NOT DETECTED
E coli 0157, PCR: NOT DETECTED
Giardia lamblia, PCR: NOT DETECTED
Norovirus, PCR: NOT DETECTED
Rotavirus A, PCR: NOT DETECTED
Salmonella, PCR: NOT DETECTED
Shigella, PCR: NOT DETECTED

## 2019-02-21 ENCOUNTER — Ambulatory Visit (INDEPENDENT_AMBULATORY_CARE_PROVIDER_SITE_OTHER): Payer: Medicare Other | Admitting: Gastroenterology

## 2019-02-21 ENCOUNTER — Encounter: Payer: Self-pay | Admitting: Gastroenterology

## 2019-02-21 ENCOUNTER — Other Ambulatory Visit: Payer: Self-pay

## 2019-02-21 VITALS — BP 100/70 | HR 60 | Temp 97.9°F | Ht 62.0 in | Wt 130.0 lb

## 2019-02-21 DIAGNOSIS — R197 Diarrhea, unspecified: Secondary | ICD-10-CM

## 2019-02-21 DIAGNOSIS — R194 Change in bowel habit: Secondary | ICD-10-CM

## 2019-02-21 MED ORDER — NA SULFATE-K SULFATE-MG SULF 17.5-3.13-1.6 GM/177ML PO SOLN: 1.0000 | Freq: Once | ORAL | 0 refills | Status: AC

## 2019-02-21 NOTE — Patient Instructions (Signed)
You have been scheduled for a colonoscopy. Please follow written instructions given to you at your visit today.  Please pick up your prep supplies at the pharmacy within the next 1-3 days. If you use inhalers (even only as needed), please bring them with you on the day of your procedure.  Continue Imodium twice daily as needed.   Start a lactose free diet x 5 days to see if it improves your symptoms.   Thank you for choosing me and [facility].  Pricilla Riffle. Dagoberto Ligas., MD., Marval Regal

## 2019-02-21 NOTE — Progress Notes (Signed)
History of Present Illness: This is a 63 year old female referred by Colon Branch, MD for the evaluation of a change in bowel habits, diarrhea. She describes loose stools 2-3 times a day, generally in the morning starting in July.  In the morning she will often have a urgent loose bowel movement prior to breakfast and then after breakfast as well.  She frequently has a urgent loose bowel movement after lunch and then no further bowel movements the rest of the day.  She occasionally notes mid abdominal cramping relieved by bowel movement.  Her prior bowel pattern was 1-2 stools per day and her stools had gradually been loosening over the past year.  GI pathogen panel was negative.  She denies any antibiotic usage or travel around the time of her bowel habit change.  Colonoscopy 10/2014 mild sigmoid diverticulosis otherwise negative. Denies weight loss, abdominal pain, constipation, melena, hematochezia, nausea, vomiting, dysphagia, reflux symptoms, chest pain.       Allergies  Allergen Reactions  . Bee Venom Anaphylaxis  . Codeine     REACTION: causes gi upset  . Other Rash    Make-up   Outpatient Medications Prior to Visit  Medication Sig Dispense Refill  . aspirin-acetaminophen-caffeine (EXCEDRIN MIGRAINE) [id number] MG per tablet Take by mouth as needed for headache.    . Bismuth Subsalicylate (PEPTO-BISMOL MAX STRENGTH PO)     . Calcium Carb-Cholecalciferol (CALCIUM 600 + D PO) Take 1 tablet by mouth 2 (two) times daily.     Marland Kitchen EPINEPHrine (EPIPEN 2-PAK) 0.3 mg/0.3 mL IJ SOAJ injection Inject 0.3 mLs (0.3 mg total) into the muscle Once PRN for up to 1 dose for anaphylaxis. 2 each 1  . FIBER PO Take 2 tablets by mouth daily.    Marland Kitchen glucosamine-chondroitin 500-400 MG tablet Take 1 tablet by mouth every other day.     . loperamide (IMODIUM A-D) 2 MG capsule     . Loratadine (KS ALLERCLEAR PO) Take 1 tablet by mouth daily.    . Melatonin 10 MG CAPS     . Multiple Vitamins-Minerals (CENTRUM  SILVER PO) Take 1 tablet by mouth daily.    . Omega-3 Fatty Acids (FISH OIL) 1000 MG CAPS Take 2 capsules by mouth daily.    . SUMAtriptan (IMITREX) 50 MG tablet Take 1 tablet (50 mg total) by mouth once as needed for migraine. 30 tablet 2  . TURMERIC PO Take by mouth daily.     No facility-administered medications prior to visit.    Past Medical History:  Diagnosis Date  . Anxiety   . Basal cell carcinoma in situ of skin of right shoulder   . Cancer ([place]) 10/2011   basal cell carcinoma, chest  . Diverticulosis   . Melanoma ([place])    left thigh  . Menopausal state   . Migraine    better after menopause   . Osteoporosis   . Post-operative nausea and vomiting   . 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 COLONOSCOPY    . G2P2    . hysterosalpilgogram, polyp removal ?     Social History   Socioeconomic History  . Marital status: Married    Spouse name: Not on file  . Number of children: 2  . Years of education: 38  . Highest education level: Not on file  Occupational History  . Occupation: photographer,retired   Social Needs  .  Financial resource strain: Not on file  . Food insecurity    Worry: Not on file    Inability: Not on file  . Transportation needs    Medical: Not on file    Non-medical: Not on file  Tobacco Use  . Smoking status: Never Smoker  . Smokeless tobacco: Never Used  Substance and Sexual Activity  . Alcohol use: Yes    Alcohol/week: 7.0 standard drinks    Types: 7 Glasses of wine per week  . Drug use: No  . Sexual activity: Yes    Partners: Male  Lifestyle  . Physical activity    Days per week: Not on file    Minutes per session: Not on file  . Stress: Not on file  Relationships  . Social Herbalist on phone: Not on file    Gets together: Not on file    Attends religious service: Not on file    Active member of club or organization: Not on file    Attends meetings  of clubs or organizations: Not on file    Relationship status: Not on file  Other Topics Concern  . Not on file  Social History Narrative   Household- pt and husband   HSG, [employer].Married '80 Children: 2 sons; '84 and '87; 2 grandchildren.    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    Son w/ Bipolar doing ok as off 12/2017       Family History  Problem Relation Age of Onset  . Dementia Mother   . Hypertension Mother   . Hypothyroidism Mother   . Hyperlipidemia Mother   . Osteoporosis Mother   . Osteoporosis Father   . Hypertension Father   . Osteoporosis Sister   . Hypothyroidism Sister   . Heart disease Brother        dx in his 63s  . Hypertension Brother   . Diabetes Maternal Grandmother   . Polycystic kidney disease Other   . COPD Neg Hx   . Colon cancer Neg Hx   . Breast cancer Neg Hx         Review of Systems: Pertinent positive and negative review of systems were noted in the above HPI section. All other review of systems were otherwise negative.   Physical Exam: General: Well developed, well nourished, no acute distress Head: Normocephalic and atraumatic Eyes:  sclerae anicteric, EOMI Ears: Normal auditory acuity Mouth: No deformity or lesions Neck: Supple, no masses or thyromegaly Lungs: Clear throughout to auscultation Heart: Regular rate and rhythm; no murmurs, rubs or bruits Abdomen: Soft, non tender and non distended. No masses, hepatosplenomegaly or hernias noted. Normal Bowel sounds Rectal: Deferred to colonoscopy Musculoskeletal: Symmetrical with no gross deformities  Skin: No lesions on visible extremities Pulses:  Normal pulses noted Extremities: No clubbing, cyanosis, edema or deformities noted Neurological: Alert oriented x 4, grossly nonfocal Cervical Nodes:  No significant cervical adenopathy Inguinal Nodes: No significant inguinal adenopathy Psychological:  Alert and cooperative. Normal mood and affect   Assessment and  Recommendations:  1.  Change in bowel habits, diarrhea. Lactose free for 5 days.  Rule out IBD, microscopic colitis, IBS.  Imodium 1/2-1 whole tablet p.o. twice daily as needed.  Schedule colonoscopy. The risks (including bleeding, perforation, infection, missed lesions, medication reactions and possible hospitalization or surgery if complications occur), benefits, and alternatives to colonoscopy with possible biopsy and possible polypectomy were discussed with the patient and they consent to proceed.  cc: Colon Branch, MD [address] [place],  [place] [id number]

## 2019-02-24 ENCOUNTER — Telehealth: Payer: Self-pay | Admitting: Gastroenterology

## 2019-02-24 NOTE — Telephone Encounter (Signed)

## 2019-02-27 ENCOUNTER — Encounter: Payer: Self-pay | Admitting: Gastroenterology

## 2019-02-27 ENCOUNTER — Other Ambulatory Visit: Payer: Self-pay

## 2019-02-27 ENCOUNTER — Ambulatory Visit (AMBULATORY_SURGERY_CENTER): Payer: Medicare Other | Admitting: Gastroenterology

## 2019-02-27 ENCOUNTER — Other Ambulatory Visit: Payer: Self-pay | Admitting: Gastroenterology

## 2019-02-27 VITALS — BP 129/56 | HR 62 | Temp 98.0°F | Resp 16 | Ht 62.0 in | Wt 130.0 lb

## 2019-02-27 DIAGNOSIS — K640 First degree hemorrhoids: Secondary | ICD-10-CM | POA: Diagnosis not present

## 2019-02-27 DIAGNOSIS — K5730 Diverticulosis of large intestine without perforation or abscess without bleeding: Secondary | ICD-10-CM | POA: Diagnosis not present

## 2019-02-27 DIAGNOSIS — R194 Change in bowel habit: Secondary | ICD-10-CM

## 2019-02-27 DIAGNOSIS — R197 Diarrhea, unspecified: Secondary | ICD-10-CM

## 2019-02-27 DIAGNOSIS — K52832 Lymphocytic colitis: Secondary | ICD-10-CM | POA: Diagnosis not present

## 2019-02-27 MED ORDER — SODIUM CHLORIDE 0.9 % IV SOLN: 500.0000 mL | Freq: Once | INTRAVENOUS | Status: DC

## 2019-02-27 NOTE — Op Note (Signed)
[facility] Patient Name: Laurie Johns Procedure Date: 02/27/2019 2:18 PM MRN: [id number] Endoscopist: Ladene Artist , MD Age: 63 Referring MD:  Date of Birth: 1955/12/27 Gender: Female Account #: [id number] Procedure:                Colonoscopy Indications:              Clinically significant diarrhea of unexplained                            origin, Incidental change in bowel habits noted Medicines:                Monitored Anesthesia Care Procedure:                Pre-Anesthesia Assessment:                           - Prior to the procedure, a History and Physical                            was performed, and patient medications and                            allergies were reviewed. The patient's tolerance of                            previous anesthesia was also reviewed. The risks                            and benefits of the procedure and the sedation                            options and risks were discussed with the patient.                            All questions were answered, and informed consent                            was obtained. Prior Anticoagulants: The patient has                            taken no previous anticoagulant or antiplatelet                            agents. ASA Grade Assessment: II - A patient with                            mild systemic disease. After reviewing the risks                            and benefits, the patient was deemed in                            satisfactory condition to undergo the procedure.  After obtaining informed consent, the colonoscope                            was passed under direct vision. Throughout the                            procedure, the patient's blood pressure, pulse, and                            oxygen saturations were monitored continuously. The                            Colonoscope was introduced through the anus and                            advanced to the  the terminal ileum, with                            identification of the appendiceal orifice and IC                            valve. The terminal ileum, ileocecal valve,                            appendiceal orifice, and rectum were photographed.                            The quality of the bowel preparation was good. The                            colonoscopy was performed without difficulty. The                            patient tolerated the procedure well. Scope In: 2:25:12 PM Scope Out: 2:41:36 PM Scope Withdrawal Time: 0 hours 12 minutes 18 seconds  Total Procedure Duration: 0 hours 16 minutes 24 seconds  Findings:                 The perianal and digital rectal examinations were                            normal.                           The terminal ileum appeared normal.                           A few small-mouthed diverticula were found in the                            sigmoid colon.                           Internal hemorrhoids were found during  retroflexion. The hemorrhoids were small and Grade                            I (internal hemorrhoids that do not prolapse).                           The exam was otherwise without abnormality on                            direct and retroflexion views. Random biopsies                            obtained throughout. Complications:            No immediate complications. Estimated blood loss:                            None. Estimated Blood Loss:     Estimated blood loss: none. Impression:               - The examined portion of the ileum was normal.                           - Mild diverticulosis in the sigmoid colon.                           - Internal hemorrhoids.                           - The examination was otherwise normal on direct                            and retroflexion views. Biopsies obtained. Recommendation:           - Repeat colonoscopy in 10 years for screening                             purposes.                           - Patient has a contact number available for                            emergencies. The signs and symptoms of potential                            delayed complications were discussed with the                            patient. Return to normal activities tomorrow.                            Written discharge instructions were provided to the                            patient.                           -  Resume previous diet.                           - Continue present medications.                           - Await pathology results. Ladene Artist, MD 02/27/2019 2:46:17 PM This report has been signed electronically.

## 2019-02-27 NOTE — Patient Instructions (Signed)
Handout given : Diverticulosis and Hemorrhoids   YOU HAD AN ENDOSCOPIC PROCEDURE TODAY AT [facility]:   Refer to the procedure report that was given to you for any specific questions about what was found during the examination.  If the procedure report does not answer your questions, please call your gastroenterologist to clarify.  If you requested that your care partner not be given the details of your procedure findings, then the procedure report has been included in a sealed envelope for you to review at your convenience later.  YOU SHOULD EXPECT: Some feelings of bloating in the abdomen. Passage of more gas than usual.  Walking can help get rid of the air that was put into your GI tract during the procedure and reduce the bloating. If you had a lower endoscopy (such as a colonoscopy or flexible sigmoidoscopy) you may notice spotting of blood in your stool or on the toilet paper. If you underwent a bowel prep for your procedure, you may not have a normal bowel movement for a few days.  Please Note:  You might notice some irritation and congestion in your nose or some drainage.  This is from the oxygen used during your procedure.  There is no need for concern and it should clear up in a day or so.  SYMPTOMS TO REPORT IMMEDIATELY:   Following lower endoscopy (colonoscopy or flexible sigmoidoscopy):  Excessive amounts of blood in the stool  Significant tenderness or worsening of abdominal pains  Swelling of the abdomen that is new, acute  Fever of 100F or higher   For urgent or emergent issues, a gastroenterologist can be reached at any hour by calling [phone].   DIET:  We do recommend a small meal at first, but then you may proceed to your regular diet.  Drink plenty of fluids but you should avoid alcoholic beverages for 24 hours.  ACTIVITY:  You should plan to take it easy for the rest of today and you should NOT DRIVE or use heavy machinery until tomorrow (because  of the sedation medicines used during the test).    FOLLOW UP: Our staff will call the number listed on your records 48-72 hours following your procedure to check on you and address any questions or concerns that you may have regarding the information given to you following your procedure. If we do not reach you, we will leave a message.  We will attempt to reach you two times.  During this call, we will ask if you have developed any symptoms of COVID 19. If you develop any symptoms (ie: fever, flu-like symptoms, shortness of breath, cough etc.) before then, please call [phone].  If you test positive for Covid 19 in the 2 weeks post procedure, please call and report this information to [place].    If any biopsies were taken you will be contacted by phone or by letter within the next 1-3 weeks.  Please call us at [phone] if you have not heard about the biopsies in 3 weeks.    SIGNATURES/CONFIDENTIALITY: You and/or your care partner have signed paperwork which will be entered into your electronic medical record.  These signatures attest to the fact that that the information above on your After Visit Summary has been reviewed and is understood.  Full responsibility of the confidentiality of this discharge information lies with you and/or your care-partner.

## 2019-02-27 NOTE — Progress Notes (Signed)
Called to room to assist during endoscopic procedure.  Patient ID and intended procedure confirmed with present staff. Received instructions for my participation in the procedure from the performing physician.  

## 2019-02-27 NOTE — Progress Notes (Signed)
A/ox3, pleased with MAC, report to RN 

## 2019-03-01 ENCOUNTER — Telehealth: Payer: Self-pay

## 2019-03-01 NOTE — Telephone Encounter (Signed)
  Follow up Call-  Call back number 02/27/2019  Post procedure Call Back phone  # [phone]  Permission to leave phone message Yes  Some recent data might be hidden     Patient questions:  Do you have a fever, pain , or abdominal swelling? No. Pain Score  0 *  Have you tolerated food without any problems? Yes.    Have you been able to return to your normal activities? Yes.    Do you have any questions about your discharge instructions: Diet   No. Medications  No. Follow up visit  No.  Do you have questions or concerns about your Care? No.  Actions: * If pain score is 4 or above: No action needed, pain <4.  1. Have you developed a fever since your procedure? no  2.   Have you had an respiratory symptoms (SOB or cough) since your procedure? no  3.   Have you tested positive for COVID 19 since your procedure no  4.   Have you had any family members/close contacts diagnosed with the COVID 19 since your procedure?  no   If yes to any of these questions please route to Joylene John, RN and Alphonsa Gin, Therapist, sports.

## 2019-03-28 LAB — HM MAMMOGRAPHY

## 2019-04-03 ENCOUNTER — Encounter: Payer: Self-pay | Admitting: Internal Medicine

## 2019-04-24 ENCOUNTER — Other Ambulatory Visit: Payer: Self-pay

## 2019-04-24 DIAGNOSIS — Z20822 Contact with and (suspected) exposure to covid-19: Secondary | ICD-10-CM

## 2019-04-26 LAB — NOVEL CORONAVIRUS, NAA: SARS-CoV-2, NAA: NOT DETECTED

## 2019-07-18 ENCOUNTER — Ambulatory Visit: Payer: Medicare Other

## 2019-07-20 ENCOUNTER — Ambulatory Visit: Payer: Medicare Other

## 2020-02-06 ENCOUNTER — Encounter: Payer: Medicare Other | Admitting: Internal Medicine

## 2020-04-09 LAB — HM MAMMOGRAPHY

## 2020-04-11 ENCOUNTER — Encounter: Payer: Self-pay | Admitting: Internal Medicine

## 2020-04-11 ENCOUNTER — Other Ambulatory Visit: Payer: Self-pay

## 2020-04-11 ENCOUNTER — Ambulatory Visit (INDEPENDENT_AMBULATORY_CARE_PROVIDER_SITE_OTHER): Payer: Medicare Other | Admitting: Internal Medicine

## 2020-04-11 VITALS — BP 114/74 | HR 69 | Temp 97.8°F | Resp 12 | Ht 62.0 in | Wt 133.0 lb

## 2020-04-11 DIAGNOSIS — R194 Change in bowel habit: Secondary | ICD-10-CM | POA: Diagnosis not present

## 2020-04-11 DIAGNOSIS — M810 Age-related osteoporosis without current pathological fracture: Secondary | ICD-10-CM | POA: Diagnosis not present

## 2020-04-11 DIAGNOSIS — E785 Hyperlipidemia, unspecified: Secondary | ICD-10-CM | POA: Diagnosis not present

## 2020-04-11 DIAGNOSIS — F419 Anxiety disorder, unspecified: Secondary | ICD-10-CM | POA: Diagnosis not present

## 2020-04-11 NOTE — Patient Instructions (Signed)
   GO TO THE LAB : Get the blood work     [place], [place] back for a checkup in 1 year

## 2020-04-11 NOTE — Progress Notes (Signed)
Subjective:    Patient ID: Laurie Johns, female    DOB: [birth year], 64 y.o.   MRN: [id number]  DOS:  04/11/2020 Type of visit - description: Routine checkup Doing well, she has no major concerns.    Review of Systems She is active, does hiking sometimes, no chest pain no difficulty breathing No edema No blood in the stools or in the urine   Past Medical History:  Diagnosis Date  . Anxiety   . Asthma    as a child  . Basal cell carcinoma in situ of skin of right shoulder   . Cancer ([place]) 10/2011   basal cell carcinoma, chest  . Diverticulosis   . Melanoma ([place])    left thigh  . Menopausal state   . Migraine    better after menopause   . Osteoporosis   . Post-operative nausea and vomiting   . 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 COLONOSCOPY    . G2P2    . hysterosalpilgogram, polyp removal ?      Allergies as of 04/11/2020      Reactions   Bee Venom Anaphylaxis   Codeine    REACTION: causes gi upset   Other Rash   Make-up      Medication List       Accurate as of April 11, 2020 11:59 PM. If you have any questions, ask your nurse or doctor.        STOP taking these medications   Imodium A-D 2 MG capsule Generic drug: loperamide Stopped by: Kathlene November, MD   PEPTO-BISMOL [place] by: Kathlene November, MD     TAKE these medications   aspirin-acetaminophen-caffeine [phone] MG tablet Commonly known as: EXCEDRIN MIGRAINE Take by mouth as needed for headache.   CALCIUM 600 + D PO Take 1 tablet by mouth 2 (two) times daily.   CENTRUM SILVER PO Take 1 tablet by mouth daily.   EPINEPHrine 0.3 mg/0.3 mL Soaj injection Commonly known as: EpiPen 2-Pak Inject 0.3 mLs (0.3 mg total) into the muscle Once PRN for up to 1 dose for anaphylaxis.   FIBER PO Take 2 tablets by mouth daily.   Fish Oil 1000 MG Caps Take 2 capsules by mouth daily.   glucosamine-chondroitin  500-400 MG tablet Take 1 tablet by mouth every other day.   KS ALLERCLEAR PO Take 1 tablet by mouth daily.   Melatonin 10 MG Caps   SUMAtriptan 50 MG tablet Commonly known as: Imitrex Take 1 tablet (50 mg total) by mouth once as needed for migraine.   TURMERIC PO Take by mouth daily.          Objective:   Physical Exam BP 114/74 (BP Location: Right Arm, Cuff Size: Normal)   Pulse 69   Temp 97.8 F (36.6 C) (Oral)   Resp 12   Ht 5\' 2"  (1.575 m)   Wt 133 lb (60.3 kg)   SpO2 99%   BMI 24.33 kg/m  General: Well developed, NAD, BMI noted Neck: No  thyromegaly  HEENT:  Normocephalic . Face symmetric, atraumatic Lungs:  CTA B Normal respiratory effort, no intercostal retractions, no accessory muscle use. Heart: RRR,  no murmur.  Abdomen:  Not distended, soft, non-tender. No rebound or rigidity.   Lower extremities: no pretibial edema bilaterally  Skin: Exposed areas without rash. Not pale. Not jaundice Neurologic:  alert & oriented X3.  Speech normal, gait appropriate for age and unassisted Strength symmetric and appropriate for age.  Psych: Cognition and judgment appear intact.  Cooperative with normal attention span and concentration.  Behavior appropriate. No anxious or depressed appearing.     Assessment      Assessment  (transfer Dr Linda Hedges 08-2014) Anxiety - occ sx (son dx w/ bipolar) H/o asthma  DJD- sees MD in [facility] Osteoporosis- sees MD in [place], on reclast Migraines, better after menopause Menopausal state Skin cancer: Melanoma, BCC, SCC ----> Sees dermatology yearly Bee stings allergy-- has a epipen   PLAN: Anxiety: Not an issue at this point  DJD, osteoporosis: The doctor managing those issues ask for a vitamin D level, will do. Migraines: Signing at this point. Skin cancer: Sees dermatology Mild dyslipidemia: Has a very healthy lifestyle, check a CMP and FLP Change in bowel habits: Was referred to GI, had a colonoscopy 02/27/2019.   Essentially normal except for diverticulosis and hemorrhoids.next cscope 10 years, sxs self resolved Preventive care reviewed RTC 1 year    This visit occurred during the SARS-CoV-2 public health emergency.  Safety protocols were in place, including screening questions prior to the visit, additional usage of staff PPE, and extensive cleaning of exam room while observing appropriate contact time as indicated for disinfecting solutions.

## 2020-04-12 LAB — COMPREHENSIVE METABOLIC PANEL
AG Ratio: 1.7 (calc) (ref 1.0–2.5)
ALT: 16 U/L (ref 6–29)
AST: 17 U/L (ref 10–35)
Albumin: 4.2 g/dL (ref 3.6–5.1)
Alkaline phosphatase (APISO): 66 U/L (ref 37–153)
BUN/Creatinine Ratio: 31 (calc) — ABNORMAL HIGH (ref 6–22)
BUN: 26 mg/dL — ABNORMAL HIGH (ref 7–25)
CO2: 27 mmol/L (ref 20–32)
Calcium: 9.9 mg/dL (ref 8.6–10.4)
Chloride: 102 mmol/L (ref 98–110)
Creat: 0.85 mg/dL (ref 0.50–0.99)
Globulin: 2.5 g/dL (calc) (ref 1.9–3.7)
Glucose, Bld: 94 mg/dL (ref 65–99)
Potassium: 4.4 mmol/L (ref 3.5–5.3)
Sodium: 139 mmol/L (ref 135–146)
Total Bilirubin: 0.4 mg/dL (ref 0.2–1.2)
Total Protein: 6.7 g/dL (ref 6.1–8.1)

## 2020-04-12 LAB — LIPID PANEL
Cholesterol: 205 mg/dL — ABNORMAL HIGH (ref <200)
HDL: 63 mg/dL (ref >=50)
LDL Cholesterol (Calc): 126 mg/dL (calc) — ABNORMAL HIGH
Non-HDL Cholesterol (Calc): 142 mg/dL (calc) — ABNORMAL HIGH (ref <130)
Total CHOL/HDL Ratio: 3.3 (calc) (ref <5.0)
Triglycerides: 68 mg/dL (ref <150)

## 2020-04-12 LAB — VITAMIN D 25 HYDROXY (VIT D DEFICIENCY, FRACTURES): Vit D, 25-Hydroxy: 52 ng/mL (ref 30–100)

## 2020-04-13 NOTE — Assessment & Plan Note (Signed)
Anxiety: Not an issue at this point  DJD, osteoporosis: The doctor managing those issues ask for a vitamin D level, will do. Migraines: Signing at this point. Skin cancer: Sees dermatology Mild dyslipidemia: Has a very healthy lifestyle, check a CMP and FLP Change in bowel habits: Was referred to GI, had a colonoscopy 02/27/2019.  Essentially normal except for diverticulosis and hemorrhoids.next cscope 10 years, sxs self resolved Preventive care reviewed RTC 1 year

## 2020-04-13 NOTE — Assessment & Plan Note (Signed)
Preventive care reviewed: Covid vaccinations x 3  Had a Flu shot Last mammogram per K PN: 04/09/2020 C-scope 02-2019, 10 years

## 2020-04-19 ENCOUNTER — Encounter: Payer: Medicare Other | Admitting: Internal Medicine

## 2021-02-25 LAB — HM DEXA SCAN

## 2021-04-15 LAB — HM MAMMOGRAPHY

## 2021-04-16 ENCOUNTER — Ambulatory Visit (INDEPENDENT_AMBULATORY_CARE_PROVIDER_SITE_OTHER): Payer: Medicare Other | Admitting: Internal Medicine

## 2021-04-16 ENCOUNTER — Encounter: Payer: Self-pay | Admitting: Internal Medicine

## 2021-04-16 ENCOUNTER — Other Ambulatory Visit: Payer: Self-pay

## 2021-04-16 VITALS — BP 134/84 | HR 67 | Temp 97.9°F | Resp 18 | Ht 62.0 in | Wt 134.4 lb

## 2021-04-16 DIAGNOSIS — E785 Hyperlipidemia, unspecified: Secondary | ICD-10-CM

## 2021-04-16 DIAGNOSIS — M810 Age-related osteoporosis without current pathological fracture: Secondary | ICD-10-CM | POA: Diagnosis not present

## 2021-04-16 LAB — CBC WITH DIFFERENTIAL/PLATELET
Basophils Absolute: 0 10*3/uL (ref 0.0–0.1)
Basophils Relative: 0.5 % (ref 0.0–3.0)
Eosinophils Absolute: 0.2 10*3/uL (ref 0.0–0.7)
Eosinophils Relative: 1.9 % (ref 0.0–5.0)
HCT: 42.6 % (ref 36.0–46.0)
Hemoglobin: 14.1 g/dL (ref 12.0–15.0)
Lymphocytes Relative: 29.1 % (ref 12.0–46.0)
Lymphs Abs: 2.4 10*3/uL (ref 0.7–4.0)
MCHC: 33.1 g/dL (ref 30.0–36.0)
MCV: 93.1 fl (ref 78.0–100.0)
Monocytes Absolute: 0.7 10*3/uL (ref 0.1–1.0)
Monocytes Relative: 8.7 % (ref 3.0–12.0)
Neutro Abs: 5 10*3/uL (ref 1.4–7.7)
Neutrophils Relative %: 59.8 % (ref 43.0–77.0)
Platelets: 238 10*3/uL (ref 150.0–400.0)
RBC: 4.58 Mil/uL (ref 3.87–5.11)
RDW: 13.4 % (ref 11.5–15.5)
WBC: 8.4 10*3/uL (ref 4.0–10.5)

## 2021-04-16 LAB — COMPREHENSIVE METABOLIC PANEL
ALT: 12 U/L (ref 0–35)
AST: 17 U/L (ref 0–37)
Albumin: 4.3 g/dL (ref 3.5–5.2)
Alkaline Phosphatase: 81 U/L (ref 39–117)
BUN: 20 mg/dL (ref 6–23)
CO2: 30 mEq/L (ref 19–32)
Calcium: 9.3 mg/dL (ref 8.4–10.5)
Chloride: 101 mEq/L (ref 96–112)
Creatinine, Ser: 0.87 mg/dL (ref 0.40–1.20)
GFR: 68.97 mL/min (ref >60.00)
Glucose, Bld: 91 mg/dL (ref 70–99)
Potassium: 4.6 mEq/L (ref 3.5–5.1)
Sodium: 137 mEq/L (ref 135–145)
Total Bilirubin: 0.5 mg/dL (ref 0.2–1.2)
Total Protein: 7.1 g/dL (ref 6.0–8.3)

## 2021-04-16 LAB — LIPID PANEL
Cholesterol: 213 mg/dL — ABNORMAL HIGH (ref 0–200)
HDL: 69.2 mg/dL (ref >39.00)
LDL Cholesterol: 130 mg/dL — ABNORMAL HIGH (ref 0–99)
NonHDL: 143.52
Total CHOL/HDL Ratio: 3
Triglycerides: 69 mg/dL (ref 0.0–149.0)
VLDL: 13.8 mg/dL (ref 0.0–40.0)

## 2021-04-16 LAB — VITAMIN D 25 HYDROXY (VIT D DEFICIENCY, FRACTURES): VITD: 47.94 ng/mL (ref 30.00–100.00)

## 2021-04-16 MED ORDER — AMOXICILLIN 875 MG PO TABS: 875.0000 mg | ORAL_TABLET | Freq: Two times a day (BID) | ORAL | 0 refills | Status: DC

## 2021-04-16 NOTE — Patient Instructions (Signed)
  For nasal congestion:  Use OTC  Flonase  (generics are okay and less expensive): 2 nasal sprays on each side of the nose in the morning until you feel better  Use OTC Astepro 2 nasal sprays on each side of the nose twice daily until better  Avoid decongestants such as  Pseudoephedrine or phenylephrine     Take the antibiotic as prescribed  (Amoxicillin) only if not better in the next 5 to 6 days  Call if symptoms continue.  GO TO THE LAB : Get the blood work     [place], [place] back for  a check up in 1 year        "Living will", "[place] of attorney": Advanced care planning  (If you already have a living will or healthcare power of attorney, please bring the copy to be scanned in your chart.)  Advance care planning is a process that supports adults in  understanding and sharing their preferences regarding future medical care.   The patient's preferences are recorded in documents called Advance Directives.    Advanced directives are completed (and can be modified at any time) while the patient is in full mental capacity.   The documentation should be available at all times to the patient, the family and the healthcare providers.  Bring in a copy to be scanned in your chart is an excellent idea and is recommended   This legal documents direct treatment decision making and/or appoint a surrogate to make the decision if the patient is not capable to do so.    Advance directives can be documented in many types of formats,  documents have names such as:  Lliving will  Durable power of attorney for healthcare (healthcare proxy or healthcare power of attorney)  Combined directives  Physician orders for life-sustaining treatment    More information at:  [email or web address]

## 2021-04-16 NOTE — Progress Notes (Signed)
Subjective:    Patient ID: Laurie Johns, female    DOB: 12/22/1955, 65 y.o.   MRN: [id number]  DOS:  04/16/2021 Type of visit - description: ROV  In general feels well. Did had COVID few weeks ago,  residual symptom is sinus congestion, still blowing dark mucus. Very mild cough, no fever. Today we also talk about her chronic medical issues. She remains active.   Review of Systems Denies chest pain or difficulty breathing No LUTS.   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     Social History   Socioeconomic History   Marital status: Married    Spouse name: Not on file   Number of children: 2   Years of education: 16   Highest education level: Not on file  Occupational History   Occupation: photographer,retired   Tobacco Use   Smoking status: Never   Smokeless tobacco: Never  Substance and Sexual Activity   Alcohol use: Yes    Alcohol/week: 7.0 standard drinks    Types: 7 Glasses of wine per week   Drug use: No   Sexual activity: Yes    Partners: Male  Other Topics Concern   Not on file  Social History Narrative   Household- pt and husband   HSG, [employer].Married '80 Children: 2 sons; '84 and '87; 2 grandchildren.    Scientist, forensic.  retired.    Son w/ Bipolar doing ok as off 12/2017       Social Determinants of Health   Financial Resource Strain: Not on file  Food Insecurity: Not on file  Transportation Needs: Not on file  Physical Activity: Not on file  Stress:  Not on file  Social Connections: Not on file  Intimate Partner Violence: Not on file    Allergies as of 04/16/2021       Reactions   Bee Venom Anaphylaxis   Codeine    REACTION: causes gi upset   Other Rash   Make-up        Medication List        Accurate as of April 16, 2021 11:59 PM. If you have any questions, ask your nurse or doctor.          amoxicillin 875 MG tablet Commonly known as: AMOXIL Take 1 tablet (875 mg total) by mouth 2 (two) times daily. Started by: Kathlene November, MD   aspirin-acetaminophen-caffeine [phone] MG tablet Commonly known as: EXCEDRIN MIGRAINE Take by mouth as needed for headache.   CALCIUM 600 + D PO Take 1 tablet by mouth 2 (two) times daily.   CENTRUM SILVER PO Take 1 tablet by mouth daily.   EPINEPHrine 0.3 mg/0.3 mL Soaj injection Commonly known as: EpiPen 2-Pak Inject 0.3 mLs (0.3 mg total) into the muscle Once PRN for up to 1 dose for anaphylaxis.   FIBER PO  Take 2 tablets by mouth daily.   Fish Oil 1000 MG Caps Take 2 capsules by mouth daily.   glucosamine-chondroitin 500-400 MG tablet Take 1 tablet by mouth every other day.   KS ALLERCLEAR PO Take 1 tablet by mouth daily.   Melatonin 10 MG Caps   SUMAtriptan 50 MG tablet Commonly known as: Imitrex Take 1 tablet (50 mg total) by mouth once as needed for migraine.   TURMERIC PO Take by mouth daily.           Objective:   Physical Exam BP 134/84 (BP Location: Left Arm, Patient Position: Sitting, Cuff Size: Normal)   Pulse 67   Temp 97.9 F (36.6 C) (Oral)   Resp 18   Ht 5\' 2"  (1.575 m)   Wt 134 lb 6.4 oz (61 kg)   SpO2 99%   BMI 24.58 kg/m  General: Well developed, NAD, BMI noted Neck: No  thyromegaly  HEENT:  Normocephalic . Face symmetric, atraumatic. TMs: Slightly bulged but not red.  Nose minimally congested, sinuses non-TTP.  Throat symmetric, no red. Lungs:  CTA B Normal respiratory effort, no intercostal retractions, no accessory muscle  use. Heart: RRR,  no murmur.  Abdomen:  Not distended, soft, non-tender. No rebound or rigidity.   Lower extremities: no pretibial edema bilaterally  Skin: Exposed areas without rash. Not pale. Not jaundice Neurologic:  alert & oriented X3.  Speech normal, gait appropriate for age and unassisted Strength symmetric and appropriate for age.  Psych: Cognition and judgment appear intact.  Cooperative with normal attention span and concentration.  Behavior appropriate. No anxious or depressed appearing.     Assessment       Assessment  (transfer Dr Linda Hedges 08-2014) Anxiety - occ sx (son dx w/ bipolar) H/o asthma  DJD- sees MD in [place] Osteoporosis- sees MD in [place], on reclast Migraines, better after menopause Menopausal state Skin cancer: Melanoma, BCC, SCC ----> Sees dermatology yearly Bee stings allergy-- has a epipen + CAD brother in his 27s Covid 03-2021  PLAN: Anxiety, asthma, migraines: Currently inactive. Osteoporosis: Under the care of another physician, check vitamin D at her request. Sees dermatology for history of skin cancer + FH CAD: Controlling CV RF.  Benefit of statins discussed Mild dyslipidemia: diet controlled, labs. Sinusitis: possible sinusitis, see HPI, trial with Flonase, Astepro.  If not better proceed with amoxicillin Preventive care reviewed. RTC 1 year.     This visit occurred during the SARS-CoV-2 public health emergency.  Safety protocols were in place, including screening questions prior to the visit, additional usage of staff PPE, and extensive cleaning of exam room while observing appropriate contact time as indicated for disinfecting solutions.

## 2021-04-17 NOTE — Assessment & Plan Note (Signed)
-  Td 2015 - Pnm shot 2016 -prevnar 2017 -zostavax 02-2016 -S/p shingrex x 2 - covid vax : last 02-2021 -- had a flu shot  -CCS: Cscope 2006, repeated cscope 10-2014 no polyps, 10 years per report -female care:  used to see  Dr Edwyna Ready, rec to call them ;  MMG: done yesterday; Negative Pap smear 10/20/2017 - POA d/w pt

## 2021-04-17 NOTE — Assessment & Plan Note (Signed)
Anxiety, asthma, migraines: Currently inactive. Osteoporosis: Under the care of another physician, check vitamin D at her request. Sees dermatology for history of skin cancer + FH CAD: Controlling CV RF.  Benefit of statins discussed Mild dyslipidemia: diet controlled, labs. Sinusitis: possible sinusitis, see HPI, trial with Flonase, Astepro.  If not better proceed with amoxicillin Preventive care reviewed. RTC 1 year.

## 2021-04-18 ENCOUNTER — Encounter: Payer: Self-pay | Admitting: Internal Medicine

## 2021-06-02 ENCOUNTER — Encounter: Payer: Self-pay | Admitting: Internal Medicine

## 2021-06-02 DIAGNOSIS — J90 Pleural effusion, not elsewhere classified: Secondary | ICD-10-CM | POA: Insufficient documentation

## 2021-06-02 DIAGNOSIS — J9811 Atelectasis: Secondary | ICD-10-CM | POA: Insufficient documentation

## 2021-06-02 DIAGNOSIS — S2242XA Multiple fractures of ribs, left side, initial encounter for closed fracture: Secondary | ICD-10-CM | POA: Insufficient documentation

## 2021-06-03 ENCOUNTER — Other Ambulatory Visit: Payer: Self-pay

## 2021-06-04 ENCOUNTER — Ambulatory Visit (INDEPENDENT_AMBULATORY_CARE_PROVIDER_SITE_OTHER): Payer: Medicare Other | Admitting: Internal Medicine

## 2021-06-04 ENCOUNTER — Encounter: Payer: Self-pay | Admitting: Internal Medicine

## 2021-06-04 VITALS — BP 126/66 | HR 66 | Temp 98.1°F | Resp 18 | Ht 62.0 in | Wt 141.5 lb

## 2021-06-04 DIAGNOSIS — Z09 Encounter for follow-up examination after completed treatment for conditions other than malignant neoplasm: Secondary | ICD-10-CM

## 2021-06-04 DIAGNOSIS — J9811 Atelectasis: Secondary | ICD-10-CM

## 2021-06-04 DIAGNOSIS — S2242XD Multiple fractures of ribs, left side, subsequent encounter for fracture with routine healing: Secondary | ICD-10-CM

## 2021-06-04 DIAGNOSIS — S2242XA Multiple fractures of ribs, left side, initial encounter for closed fracture: Secondary | ICD-10-CM | POA: Diagnosis not present

## 2021-06-04 MED ORDER — OXYCODONE HCL 5 MG PO TABS
5.0000 mg | ORAL_TABLET | Freq: Four times a day (QID) | ORAL | 0 refills | Status: DC | PRN
Start: 2021-06-04 — End: 2022-04-17

## 2021-06-04 NOTE — Patient Instructions (Addendum)
Tylenol  500 mg OTC 2 tabs a day every 8 hours as needed for pain    IBUPROFEN (Advil or Motrin) 200 mg 2 tablets twice a day  Always take it with food because may cause gastritis and ulcers.  If you notice nausea, stomach pain, change in the color of stools --->  Stop the medicine and let us know   Continue w/ oxycodone , repeat a dose at night if needed  Call if not gradually improvement

## 2021-06-04 NOTE — Assessment & Plan Note (Addendum)
Fractured ribs x2: Patient sustained a fall few days ago, went to a local hospital and evaluated, he returned on a couple of days later because pain was intense,   at the second visit labs were done, they were okay, CT showed 2 broken ribs on the left side. Pain is a still intense, we had a long discussion about pain management. Plan: Tylenol 3 times daily, Advil only twice daily with GI precautions. She is very afraid of taking pain medication, explained that oxycodone is appropriate in this setting, we agreed that she is low risk for abuse thus okay to take it.  Watch for somnolence. PDMP reviewed, additional oxycodone sent. Call if not gradually better. Atelectasis: Continue respiratory exercises to prevent worsening ATX

## 2021-06-04 NOTE — Progress Notes (Signed)
Subjective:    Patient ID: Laurie Johns, female    DOB: April 26, 1956, 65 y.o.   MRN: [id number]  DOS:  06/04/2021 Type of visit - description: ED follow-up  Had fall December 22 while she was in  [facility] to a local hospital x2  December 23 and  June 02, 2021.   Chart reviewed: POC creatinine 0.8, potassium 4.6.  Hemoglobin 14.7. CT chest: Nondisplaced anterior #5 and 6 rib fracture L.  Atelectasis Rx oxycodone No. 15.  The fall was mechanical from ground-level. No head or neck injuries.  She is here because she continue with pain, although slightly better than before is still very intense. Worse when she moves around, gets up or sits down.  No fever chills No nausea or vomiting Has some constipation but managed with OTCs.    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      Allergies as of 06/04/2021       Reactions   Bee Venom Anaphylaxis   Codeine    REACTION: causes gi upset   Other Rash   Make-up        Medication List        Accurate as of June 04, 2021  3:33 PM. If you have any questions, ask your nurse or doctor.          STOP taking these medications    amoxicillin 875 MG tablet Commonly known as: AMOXIL Stopped by: Kathlene November, MD       TAKE these medications    acetaminophen 500 MG tablet Commonly known as: TYLENOL Take 1,000 mg by mouth every 6 (six) hours as needed for moderate pain.    aspirin-acetaminophen-caffeine [id number] MG tablet Commonly known as: EXCEDRIN MIGRAINE Take by mouth as needed for headache.   CALCIUM 600 + D PO Take 1 tablet by mouth 2 (two) times daily.   CENTRUM SILVER PO Take 1 tablet by mouth daily.   EPINEPHrine 0.3 mg/0.3 mL Soaj injection Commonly known as: EpiPen 2-Pak Inject 0.3 mLs (0.3 mg total) into the muscle Once PRN for up to 1 dose for anaphylaxis.   FIBER PO Take 2 tablets by mouth daily.   Fish Oil 1000 MG Caps Take 2 capsules by mouth daily.   glucosamine-chondroitin 500-400 MG tablet Take 1 tablet by mouth every other day.   ibuprofen 200 MG tablet Commonly known as: ADVIL Take 200 mg by mouth every 6 (six) hours as needed for moderate pain.   KS ALLERCLEAR PO Take 1 tablet by mouth daily.   Melatonin 10 MG Caps   oxyCODONE 5 MG immediate release tablet Commonly known as: Oxy IR/ROXICODONE Take 1 tablet (5 mg total) by mouth every 6 (six) hours as needed.   SUMAtriptan 50 MG tablet Commonly known as:  Imitrex Take 1 tablet (50 mg total) by mouth once as needed for migraine.   TURMERIC PO Take by mouth daily.           Objective:   Physical Exam BP 126/66 (BP Location: Left Arm, Patient Position: Sitting, Cuff Size: Small)    Pulse 66    Temp 98.1 F (36.7 C) (Oral)    Resp 18    Ht 5\' 2"  (1.575 m)    Wt 141 lb 8 oz (64.2 kg)    SpO2 97%    BMI 25.88 kg/m  General:   Well developed, NAD, BMI noted. HEENT:  Normocephalic . Face symmetric, atraumatic Lungs:  CTA B Normal respiratory effort, no intercostal retractions, no accessory muscle use. Chest wall: Quite tender at the left lateral side Heart: RRR,  no murmur. Abdomen: Soft, nontender Lower extremities: no pretibial edema bilaterally Gait and transferring quite limited due to pain Skin: Small ecchymosis on the left side of the chest. Neurologic:  alert & oriented X3.  Speech normal  Psych--  Cognition and judgment appear intact.   Cooperative with normal attention span and concentration.  Behavior appropriate. No anxious or depressed appearing.      Assessment        Assessment  (transfer Dr Linda Hedges 08-2014) Anxiety - occ sx (son dx w/ bipolar) H/o asthma  DJD- sees MD in [facility] Osteoporosis- sees MD in [place], on reclast Migraines, better after menopause Menopausal state Skin cancer: Melanoma, BCC, SCC ----> Sees dermatology yearly Bee stings allergy-- has a epipen + CAD brother in his 69s Covid 03-2021  PLAN: Fractured ribs x2: Patient sustained a fall few days ago, went to a local hospital and evaluated, he returned on a couple of days later because pain was intense,   at the second visit labs were done, they were okay, CT showed 2 broken ribs on the left side. Pain is a still intense, we had a long discussion about pain management. Plan: Tylenol 3 times daily, Advil only twice daily with GI precautions. She is very afraid of taking pain medication, explained that oxycodone is appropriate in this setting, we agreed that she is low risk for abuse thus okay to take it.  Watch for somnolence. PDMP reviewed, additional oxycodone sent. Call if not gradually better. Atelectasis: Continue respiratory exercises to prevent worsening ATX  This visit occurred during the SARS-CoV-2 public health emergency.  Safety protocols were in place, including screening questions prior to the visit, additional usage of staff PPE, and extensive cleaning of exam room while observing appropriate contact time as indicated for disinfecting solutions.

## 2021-06-06 MED ORDER — MOLNUPIRAVIR EUA 200MG CAPSULE: 4.0000 | ORAL_CAPSULE | Freq: Two times a day (BID) | ORAL | 0 refills | Status: AC

## 2021-10-27 ENCOUNTER — Encounter: Payer: Self-pay | Admitting: Internal Medicine

## 2021-10-27 ENCOUNTER — Ambulatory Visit (INDEPENDENT_AMBULATORY_CARE_PROVIDER_SITE_OTHER): Payer: Medicare Other

## 2021-10-27 ENCOUNTER — Other Ambulatory Visit: Payer: Self-pay | Admitting: Internal Medicine

## 2021-10-27 VITALS — Ht 62.0 in | Wt 135.8 lb

## 2021-10-27 DIAGNOSIS — Z0000 Encounter for general adult medical examination without abnormal findings: Secondary | ICD-10-CM | POA: Diagnosis not present

## 2021-10-27 NOTE — Progress Notes (Cosign Needed)
Subjective:   Laurie Johns is a 66 y.o. female who presents for an Initial Medicare Annual Wellness Visit.  I connected with [name] today by telephone and verified that I am speaking with the correct person using two identifiers. Location patient: home Location provider: work Persons participating in the virtual visit: patient, Marine scientist.    I discussed the limitations, risks, security and privacy concerns of performing an evaluation and management service by telephone and the availability of in person appointments. I also discussed with the patient that there may be a patient responsible charge related to this service. The patient expressed understanding and verbally consented to this telephonic visit.    Interactive audio and video telecommunications were attempted between this provider and patient, however failed, due to patient having technical difficulties OR patient did not have access to video capability.  We continued and completed visit with audio only.  Some vital signs may be absent or patient reported.   Time Spent with patient on telephone encounter: 20 minutes   Review of Systems     Cardiac Risk Factors include: advanced age (>28mn, >>60women)     Objective:    Today's Vitals   10/27/21 1232  Weight: 135 lb 12.8 oz (61.6 kg)  Height: '5\' 2"'$  (1.575 m)   Body mass index is 24.84 kg/m.     10/27/2021   12:35 PM 06/06/2014    9:53 AM  Advanced Directives  Does Patient Have a Medical Advance Directive? Yes No  Type of Advance Directive Living will     Current Medications (verified) Outpatient Encounter Medications as of 10/27/2021  Medication Sig   acetaminophen (TYLENOL) 500 MG tablet Take 1,000 mg by mouth every 6 (six) hours as needed for moderate pain.   Calcium Carb-Cholecalciferol (CALCIUM 600 + D PO) Take 1 tablet by mouth 2 (two) times daily.    FIBER PO Take 2 tablets by mouth daily.   glucosamine-chondroitin 500-400 MG tablet Take 1 tablet by mouth  every other day.    ibuprofen (ADVIL) 200 MG tablet Take 200 mg by mouth every 6 (six) hours as needed for moderate pain.   Loratadine (KS ALLERCLEAR PO) Take 1 tablet by mouth daily.   Melatonin 10 MG CAPS    Multiple Vitamins-Minerals (CENTRUM SILVER PO) Take 1 tablet by mouth daily.   Omega-3 Fatty Acids (FISH OIL) 1000 MG CAPS Take 2 capsules by mouth daily.   oxyCODONE (OXY IR/ROXICODONE) 5 MG immediate release tablet Take 1 tablet (5 mg total) by mouth every 6 (six) hours as needed.   TURMERIC PO Take by mouth daily.   aspirin-acetaminophen-caffeine (EXCEDRIN MIGRAINE) 2244-010-27MG per tablet Take by mouth as needed for headache. (Patient not taking: Reported on 06/04/2021)   EPINEPHrine (EPIPEN 2-PAK) 0.3 mg/0.3 mL IJ SOAJ injection Inject 0.3 mLs (0.3 mg total) into the muscle Once PRN for up to 1 dose for anaphylaxis. (Patient not taking: Reported on 06/04/2021)   SUMAtriptan (IMITREX) 50 MG tablet Take 1 tablet (50 mg total) by mouth once as needed for migraine. (Patient not taking: Reported on 06/04/2021)   No facility-administered encounter medications on file as of 10/27/2021.    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     Family History  Problem Relation Age of Onset   Dementia Mother    Hypertension Mother    Hypothyroidism Mother    Hyperlipidemia Mother    Osteoporosis Mother    Osteoporosis Father    Hypertension Father     Osteoporosis Sister    Hypothyroidism Sister    Colon polyps Sister    Heart disease Brother        dx in his 60s   Hypertension Brother    Diabetes Maternal Grandmother    Polycystic kidney disease Other    COPD Neg Hx    Colon cancer Neg Hx    Breast cancer Neg Hx    Esophageal cancer Neg Hx    Stomach cancer Neg Hx    Rectal cancer Neg Hx    Social History   Socioeconomic History   Marital status: Married    Spouse name: Not on file   Number of children: 2   Years of education: 16   Highest education level: Not on file  Occupational History   Occupation: photographer,retired   Tobacco Use   Smoking status: Never   Smokeless tobacco: Never  Substance and Sexual Activity   Alcohol use: Yes    Alcohol/week: 7.0 standard drinks    Types: 7 Glasses of wine per week   Drug use: No   Sexual activity: Yes    Partners: Male  Other Topics Concern   Not on file  Social History Narrative   Household- pt and husband   HSG, -[place].Married '80 Children: 2 sons; '84 and '87; 2 grandchildren.    Scientist, forensic.  retired.    Son w/ Bipolar doing ok as off 12/2017       Social Determinants of Health   Financial Resource Strain: Low Risk    Difficulty of Paying Living Expenses: Not hard at all  Food Insecurity: No Food Insecurity   Worried About Charity fundraiser in the Last Year: Never true   Arboriculturist in the Last Year: Never true  Transportation Needs: No Transportation Needs   Lack of Transportation (Medical): No   Lack of Transportation (Non-Medical): No  Physical Activity: Insufficiently Active   Days of Exercise per Week: 3 days   Minutes of Exercise per Session: 30 min  Stress: No Stress Concern Present   Feeling of Stress : Not at all  Social Connections: Moderately Integrated   Frequency of Communication with Friends and Family: More than three times a week   Frequency of Social Gatherings with Friends and Family: Twice a week   Attends  Religious Services: Never   Marine scientist or Organizations: Yes   Attends Music therapist: More than 4 times per year   Marital Status: Married    Tobacco Counseling Counseling given: Not Answered   Clinical Intake:  Pre-visit preparation completed: Yes  Pain : No/denies pain     BMI - recorded: 24.84 Nutritional Status: BMI of 19-24  Normal Nutritional Risks: None Diabetes: No  How often do you need to have  someone help you when you read instructions, pamphlets, or other written materials from your doctor or pharmacy?: 1 - Never  Diabetic?No  Interpreter Needed?: No  Information entered by :: Caroleen Hamman LPN   Activities of Daily Living    10/27/2021   12:40 PM 04/16/2021   10:07 AM  In your present state of health, do you have any difficulty performing the following activities:  Hearing? 0 0  Vision? 0 0  Difficulty concentrating or making decisions? 0 0  Walking or climbing stairs? 0 0  Dressing or bathing? 0 0  Doing errands, shopping? 0 0  Preparing Food and eating ? N   Using the Toilet? N   In the past six months, have you accidently leaked urine? N   Do you have problems with loss of bowel control? N   Managing your Medications? N   Managing your Finances? N   Housekeeping or managing your Housekeeping? N     Patient Care Team: Colon Branch, MD as PCP - General (Internal Medicine) Brien Few, MD as Consulting Physician (Obstetrics and Gynecology) Prentiss Bells, MD as Consulting Physician (Ophthalmology) Jarome Matin, MD as Consulting Physician (Dermatology)  Indicate any recent Medical Services you may have received from other than [facility] providers in the past year (date may be approximate).     Assessment:   This is a routine wellness examination for Jaeden.  Hearing/Vision screen Hearing Screening - Comments:: No issues Vision Screening - Comments:: Last eye exam-2 months ago-[facility]  Dietary  issues and exercise activities discussed: Current Exercise Habits: Home exercise routine, Type of exercise: walking, Time (Minutes): 30, Frequency (Times/Week): 3, Weekly Exercise (Minutes/Week): 90, Intensity: Mild, Exercise limited by: None identified   Goals Addressed             This Visit's Progress    Patient Stated       Increase exercise       Depression Screen    10/27/2021   12:39 PM 04/16/2021   10:07 AM 04/11/2020   10:57 AM 01/31/2019    2:58 PM 01/04/2018    9:54 AM 12/29/2016   10:37 AM 11/20/2015    3:48 PM  PHQ 2/9 Scores  PHQ - 2 Score 0 0 0 0 0 0 0    Fall Risk    10/27/2021   12:38 PM 04/16/2021   10:07 AM 04/11/2020   10:57 AM 01/31/2019    2:59 PM 01/04/2018    9:54 AM  Fall Risk   Falls in the past year? 1 0 0 1 No  Number falls in past yr: 0 0 0 0   Injury with Fall? 1 0 0 0   Risk for fall due to : History of fall(s)      Follow up Falls prevention discussed Falls evaluation completed Falls evaluation completed      [place]:  Any stairs in or around the home? Yes  If so, are there any without handrails? No  Home free of loose throw rugs in walkways, pet beds, electrical cords, etc? Yes  Adequate lighting in your home to reduce risk of falls? Yes   ASSISTIVE DEVICES UTILIZED TO PREVENT FALLS:  Life alert? No  Use of a cane, walker or w/c? No  Grab bars in the bathroom? No  Shower chair or bench in shower? No  Elevated toilet seat or a handicapped toilet? No   TIMED UP AND GO:  Was the test performed?  No . Phone visit   Cognitive Function:Normal cognitive status assessed by this Nurse Health Advisor. No abnormalities found.          Immunizations Immunization History  Administered Date(s) Administered   Fluad Quad(high Dose 65+) 01/31/2019   Influenza Whole 06/24/2012   Influenza, High Dose Seasonal PF 04/01/2020   Influenza,inj,Quad PF,6+ Mos 03/07/2014, 02/26/2016, 04/15/2018    Influenza-Unspecified 03/17/2017, 02/18/2021   PFIZER Comirnaty(Gray Top)Covid-19 Tri-Sucrose Vaccine 10/10/2020   PFIZER(Purple Top)SARS-COV-2 Vaccination 07/03/2019, 07/24/2019, 04/01/2020, 02/18/2021   Pneumococcal Conjugate-13 11/20/2015   Pneumococcal Polysaccharide-23 08/27/2014   Tdap 08/08/2012   Zoster Recombinat (Shingrix) 01/04/2018, 04/19/2018   Zoster, Live 02/26/2016    TDAP status: Up to date  Flu Vaccine status: Up to date  Pneumococcal vaccine status: Up to date  Covid-19 vaccine status: Completed vaccines  Qualifies for Shingles Vaccine? No   Zostavax completed Yes   Shingrix Completed?: Yes  Screening Tests Health Maintenance  Topic Date Due   Pneumonia Vaccine 19+ Years old (3) 08/27/2019   INFLUENZA VACCINE  01/06/2022   MAMMOGRAM  04/15/2022   TETANUS/TDAP  08/09/2022   COLONOSCOPY (Pts 45-27yr Insurance coverage will need to be confirmed)  02/26/2029   DEXA SCAN  Completed   COVID-19 Vaccine  Completed   Hepatitis C Screening  Completed   Zoster Vaccines- Shingrix  Completed   HPV VACCINES  Aged Out    Health Maintenance  Health Maintenance Due  Topic Date Due   Pneumonia Vaccine 68 Years old (374 08/27/2019    Colorectal cancer screening: Type of screening: Colonoscopy. Completed 02/27/2019. Repeat every 10 years  Mammogram status: Completed bilateral 04/15/2021. Repeat every year  Bone Density status: Completed 2022 per patient. Results reflect: Bone density results: OSTEOPOROSIS. Repeat every 2 years.  Lung Cancer Screening: (Low Dose CT Chest recommended if Age 68-80years, 30 pack-year currently smoking OR have quit w/in 15years.) does not qualify.     Additional Screening:  Hepatitis C Screening: Completed 12/29/2016  Vision Screening: Recommended annual ophthalmology exams for early detection of glaucoma and other disorders of the eye. Is the patient up to date with their annual eye exam?  Yes  Who is the provider or what is the  name of the [facility] which the patient [facility]   Dental Screening: Recommended annual dental exams for proper oral hygiene  Community Resource Referral / Chronic Care Management: CRR required this visit?  No   CCM required this visit?  No      Plan:     I have personally reviewed and noted the following in the patient's chart:   Medical and social history Use of alcohol, tobacco or illicit drugs  Current medications and supplements including opioid prescriptions. Patient is not currently taking opioid prescriptions. Functional ability and status Nutritional status Physical activity Advanced directives List of other physicians Hospitalizations, surgeries, and ER visits in previous 12 months Vitals Screenings to include cognitive, depression, and falls Referrals and appointments  In addition, I have reviewed and discussed with patient certain preventive protocols, quality metrics, and best practice recommendations. A written personalized care plan for preventive services as well as general preventive health recommendations were provided to patient.   Due to this being a telephonic visit, the after visit summary with patients personalized plan was offered to patient via mail or my-chart. Patient would like to access on my-chart.   MMarta Antu LPN   [id number] Nurse Health Advisor  Nurse Notes: None  I have reviewed and agree with Health Coaches documentation.  Kathlene November, MD

## 2021-10-27 NOTE — Patient Instructions (Signed)
Laurie Johns , Thank you for taking time to complete your Medicare Wellness Visit. I appreciate your ongoing commitment to your health goals. Please review the following plan we discussed and let me know if I can assist you in the future.   Screening recommendations/referrals: Colonoscopy: Completed 02/27/2019-Due 02/26/2029 Mammogram: Completed 04/15/2021-Due 04/15/2022 Bone Density: Per our conversation, completed last year. Please have copy of results sent to PCP. Recommended yearly ophthalmology/optometry visit for glaucoma screening and checkup Recommended yearly dental visit for hygiene and checkup  Vaccinations: Influenza vaccine: Up to date Pneumococcal vaccine: Up to date-Please bring dates for your chart. Tdap vaccine: Up to date Shingles vaccine: Completed vaccines   Covid-19:Up to date  Advanced directives: Please bring a copy of Living Will and/or Healthcare Power of Attorney for your chart.   Conditions/risks identified: See problem list  Next appointment: Follow up in one year for your annual wellness visit    Preventive Care 65 Years and Older, Female Preventive care refers to lifestyle choices and visits with your health care provider that can promote health and wellness. What does preventive care include? A yearly physical exam. This is also called an annual well check. Dental exams once or twice a year. Routine eye exams. Ask your health care provider how often you should have your eyes checked. Personal lifestyle choices, including: Daily care of your teeth and gums. Regular physical activity. Eating a healthy diet. Avoiding tobacco and drug use. Limiting alcohol use. Practicing safe sex. Taking low-dose aspirin every day. Taking vitamin and mineral supplements as recommended by your health care provider. What happens during an annual well check? The services and screenings done by your health care provider during your annual well check will depend on your age,  overall health, lifestyle risk factors, and family history of disease. Counseling  Your health care provider may ask you questions about your: Alcohol use. Tobacco use. Drug use. Emotional well-being. Home and relationship well-being. Sexual activity. Eating habits. History of falls. Memory and ability to understand (cognition). Work and work Statistician. Reproductive health. Screening  You may have the following tests or measurements: Height, weight, and BMI. Blood pressure. Lipid and cholesterol levels. These may be checked every 5 years, or more frequently if you are over 29 years old. Skin check. Lung cancer screening. You may have this screening every year starting at age 32 if you have a 30-pack-year history of smoking and currently smoke or have quit within the past 15 years. Fecal occult blood test (FOBT) of the stool. You may have this test every year starting at age 28. Flexible sigmoidoscopy or colonoscopy. You may have a sigmoidoscopy every 5 years or a colonoscopy every 10 years starting at age 28. Hepatitis C blood test. Hepatitis B blood test. Sexually transmitted disease (STD) testing. Diabetes screening. This is done by checking your blood sugar (glucose) after you have not eaten for a while (fasting). You may have this done every 1-3 years. Bone density scan. This is done to screen for osteoporosis. You may have this done starting at age 33. Mammogram. This may be done every 1-2 years. Talk to your health care provider about how often you should have regular mammograms. Talk with your health care provider about your test results, treatment options, and if necessary, the need for more tests. Vaccines  Your health care provider may recommend certain vaccines, such as: Influenza vaccine. This is recommended every year. Tetanus, diphtheria, and acellular pertussis (Tdap, Td) vaccine. You may need a Td booster  every 10 years. Zoster vaccine. You may need this after age  66. Pneumococcal 13-valent conjugate (PCV13) vaccine. One dose is recommended after age 32. Pneumococcal polysaccharide (PPSV23) vaccine. One dose is recommended after age 54. Talk to your health care provider about which screenings and vaccines you need and how often you need them. This information is not intended to replace advice given to you by your health care provider. Make sure you discuss any questions you have with your health care provider. Document Released: 06/21/2015 Document Revised: 02/12/2016 Document Reviewed: 03/26/2015 Elsevier Interactive Patient Education  2017 [place] Prevention in the Home Falls can cause injuries. They can happen to people of all ages. There are many things you can do to make your home safe and to help prevent falls. What can I do on the outside of my home? Regularly fix the edges of walkways and driveways and fix any cracks. Remove anything that might make you trip as you walk through a door, such as a raised step or threshold. Trim any bushes or trees on the path to your home. Use bright outdoor lighting. Clear any walking paths of anything that might make someone trip, such as rocks or tools. Regularly check to see if handrails are loose or broken. Make sure that both sides of any steps have handrails. Any raised decks and porches should have guardrails on the edges. Have any leaves, snow, or ice cleared regularly. Use sand or salt on walking paths during winter. Clean up any spills in your garage right away. This includes oil or grease spills. What can I do in the bathroom? Use night lights. Install grab bars by the toilet and in the tub and shower. Do not use towel bars as grab bars. Use non-skid mats or decals in the tub or shower. If you need to sit down in the shower, use a plastic, non-slip stool. Keep the floor dry. Clean up any water that spills on the floor as soon as it happens. Remove soap buildup in the tub or shower  regularly. Attach bath mats securely with double-sided non-slip rug tape. Do not have throw rugs and other things on the floor that can make you trip. What can I do in the bedroom? Use night lights. Make sure that you have a light by your bed that is easy to reach. Do not use any sheets or blankets that are too big for your bed. They should not hang down onto the floor. Have a firm chair that has side arms. You can use this for support while you get dressed. Do not have throw rugs and other things on the floor that can make you trip. What can I do in the kitchen? Clean up any spills right away. Avoid walking on wet floors. Keep items that you use a lot in easy-to-reach places. If you need to reach something above you, use a strong step stool that has a grab bar. Keep electrical cords out of the way. Do not use floor polish or wax that makes floors slippery. If you must use wax, use non-skid floor wax. Do not have throw rugs and other things on the floor that can make you trip. What can I do with my stairs? Do not leave any items on the stairs. Make sure that there are handrails on both sides of the stairs and use them. Fix handrails that are broken or loose. Make sure that handrails are as long as the stairways. Check any carpeting to  make sure that it is firmly attached to the stairs. Fix any carpet that is loose or worn. Avoid having throw rugs at the top or bottom of the stairs. If you do have throw rugs, attach them to the floor with carpet tape. Make sure that you have a light switch at the top of the stairs and the bottom of the stairs. If you do not have them, ask someone to add them for you. What else can I do to help prevent falls? Wear shoes that: Do not have high heels. Have rubber bottoms. Are comfortable and fit you well. Are closed at the toe. Do not wear sandals. If you use a stepladder: Make sure that it is fully opened. Do not climb a closed stepladder. Make sure that  both sides of the stepladder are locked into place. Ask someone to hold it for you, if possible. Clearly mark and make sure that you can see: Any grab bars or handrails. First and last steps. Where the edge of each step is. Use tools that help you move around (mobility aids) if they are needed. These include: Canes. Walkers. Scooters. Crutches. Turn on the lights when you go into a dark area. Replace any light bulbs as soon as they burn out. Set up your furniture so you have a clear path. Avoid moving your furniture around. If any of your floors are uneven, fix them. If there are any pets around you, be aware of where they are. Review your medicines with your doctor. Some medicines can make you feel dizzy. This can increase your chance of falling. Ask your doctor what other things that you can do to help prevent falls. This information is not intended to replace advice given to you by your health care provider. Make sure you discuss any questions you have with your health care provider. Document Released: 03/21/2009 Document Revised: 10/31/2015 Document Reviewed: 06/29/2014 Elsevier Interactive Patient Education  2017 [employer].

## 2021-11-04 ENCOUNTER — Encounter: Payer: Self-pay | Admitting: Internal Medicine

## 2022-04-17 ENCOUNTER — Ambulatory Visit (INDEPENDENT_AMBULATORY_CARE_PROVIDER_SITE_OTHER): Payer: Medicare Other | Admitting: Internal Medicine

## 2022-04-17 ENCOUNTER — Encounter: Payer: Self-pay | Admitting: Internal Medicine

## 2022-04-17 VITALS — BP 122/74 | HR 74 | Temp 98.0°F | Resp 16 | Ht 62.0 in | Wt 134.5 lb

## 2022-04-17 DIAGNOSIS — M159 Polyosteoarthritis, unspecified: Secondary | ICD-10-CM

## 2022-04-17 DIAGNOSIS — M810 Age-related osteoporosis without current pathological fracture: Secondary | ICD-10-CM

## 2022-04-17 DIAGNOSIS — E785 Hyperlipidemia, unspecified: Secondary | ICD-10-CM | POA: Diagnosis not present

## 2022-04-17 DIAGNOSIS — G43809 Other migraine, not intractable, without status migrainosus: Secondary | ICD-10-CM | POA: Diagnosis not present

## 2022-04-17 DIAGNOSIS — Z8249 Family history of ischemic heart disease and other diseases of the circulatory system: Secondary | ICD-10-CM

## 2022-04-17 LAB — BASIC METABOLIC PANEL
BUN: 19 mg/dL (ref 6–23)
CO2: 29 mEq/L (ref 19–32)
Calcium: 9.3 mg/dL (ref 8.4–10.5)
Chloride: 103 mEq/L (ref 96–112)
Creatinine, Ser: 0.83 mg/dL (ref 0.40–1.20)
GFR: 72.46 mL/min (ref >60.00)
Glucose, Bld: 78 mg/dL (ref 70–99)
Potassium: 4.5 mEq/L (ref 3.5–5.1)
Sodium: 139 mEq/L (ref 135–145)

## 2022-04-17 LAB — LIPID PANEL
Cholesterol: 238 mg/dL — ABNORMAL HIGH (ref 0–200)
HDL: 62.1 mg/dL (ref >39.00)
LDL Cholesterol: 161 mg/dL — ABNORMAL HIGH (ref 0–99)
NonHDL: 176.33
Total CHOL/HDL Ratio: 4
Triglycerides: 75 mg/dL (ref 0.0–149.0)
VLDL: 15 mg/dL (ref 0.0–40.0)

## 2022-04-17 LAB — TSH: TSH: 2.16 u[IU]/mL (ref 0.35–5.50)

## 2022-04-17 LAB — VITAMIN D 25 HYDROXY (VIT D DEFICIENCY, FRACTURES): VITD: 47.31 ng/mL (ref 30.00–100.00)

## 2022-04-17 NOTE — Progress Notes (Unsigned)
Subjective:    Patient ID: Laurie Johns, female    DOB: January 18, 1956, 66 y.o.   MRN: [id number]  DOS:  04/17/2022 Type of visit - description: Routine checkup  Since the last visit she is doing well. Did have another fall May 2023.  She is an avid camper and was trying to hurry up going [place] a  [place] and fell. No major neck or back injuries. She is fully recuperated. All chronic medical problems were addressed.   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      Current Outpatient Medications  Medication Instructions   acetaminophen (TYLENOL) 1,000 mg, Every 6 hours PRN   Calcium Carb-Cholecalciferol (CALCIUM 600 + D PO) 1 tablet, Oral, 2 times daily   EPINEPHrine 0.3 mg/0.3 mL IJ SOAJ injection INJECT CONTENTS OF 1 PEN AS NEEDED FOR ALLERGIC REACTION   FIBER PO 2 tablets, Daily   glucosamine-chondroitin 500-400 MG tablet 1 tablet, Oral, Every other day   Loratadine (KS ALLERCLEAR PO) 1 tablet, Daily   Melatonin 10 MG CAPS No dose, route, or frequency recorded.   Multiple Vitamins-Minerals (CENTRUM SILVER PO) 1 tablet, Daily   Omega-3 Fatty Acids (FISH OIL) 1000 MG CAPS 2 capsules, Daily   Prolia 60 mg, Subcutaneous, Every 6 months   SUMAtriptan (IMITREX) 50 mg, Oral, Once PRN   TURMERIC PO Oral, Daily       Objective:   Physical Exam BP 122/74   Pulse 74   Temp 98 F (36.7 C) (Oral)   Resp 16   Ht '5\' 2"'$  (1.575 m)   Wt 134 lb 8 oz (61 kg)   SpO2  98%   BMI 24.60 kg/m  General: Well developed, NAD, BMI noted Neck: No  thyromegaly  HEENT:  Normocephalic . Face symmetric, atraumatic Lungs:  CTA B Normal respiratory effort, no intercostal retractions, no accessory muscle use. Heart: RRR,  no murmur.  Abdomen:  Not distended, soft, non-tender. No rebound or rigidity.   Lower extremities: no pretibial edema bilaterally  Skin: Exposed areas without rash. Not pale. Not jaundice Neurologic:  alert & oriented X3.  Speech normal, gait appropriate for age and unassisted Strength symmetric and appropriate for age.  Psych: Cognition and judgment appear intact.  Cooperative with normal attention span and concentration.  Behavior appropriate. No anxious or depressed appearing.     Assessment    Assessment  (transfer Dr Linda Hedges 08-2014) Anxiety - occ sx (son dx w/ bipolar) DJD- sees MD in [facility] Osteoporosis- sees MD in [place], on reclast Migraines, better after menopause Menopausal state Skin cancer: Melanoma, BCC, SCC ----> Sees dermatology yearly as of 04-2022 Bee stings allergy-- has a epipen + CAD brother in his 93s H/o asthma  (chilhood)  PLAN: Osteoporosis: Had a fall December 2023, had another fall  in May 2023.  Osteoporosis managed elsewhere, she was rec to proceed with Prolia.  We will check BMP, TSH and vitamin D (she takes OTC calcium/vitamin D daily). DJD: Fortunately doing well, on Tylenol as needed Migraines: Hardly ever has episode since she become  menopausal. History of his skin cancer: Sees dermatology regularly. FH CAD Brother: Laurie Johns's last LDL 130, although her risk is only 6.7%, that does not take in account her family history.  Options:, Ca+ Coronary CT or start a low-dose of a statin.  Patient will think about it. Preventive care: Discussed RTC 1 year depending on results.

## 2022-04-17 NOTE — Patient Instructions (Addendum)
    GO TO THE LAB : Get the blood work     [place], [place] back for   a checkup in 1 year depending on results.    Do you have a "Living will" or "[place] of attorney"? (Advance care planning documents)  If you already have a living will or healthcare power of attorney, is recommended you bring the copy to be scanned in your chart. The document will be available to all the doctors you see in the system.  If you don't have one, please consider create one.  Advance care planning is a process that supports adults in  understanding and sharing their preferences regarding future medical care.   The patient's preferences are recorded in documents called Advance Directives and the can be modified at any time while the patient is in full mental capacity.   The documentation should be available at all times to the patient, the family and the healthcare providers.   This legal documents direct the family or surrogate to make the decision if the patient is not capable to do so.    Advance directives can be documented in many types of formats,  documents have names such as:  Lliving will  Durable power of attorney for healthcare (healthcare proxy or healthcare power of attorney)  Combined directives  Physician orders for life-sustaining treatment    More information at:  [email or web address]

## 2022-04-19 DIAGNOSIS — M1990 Unspecified osteoarthritis, unspecified site: Secondary | ICD-10-CM | POA: Insufficient documentation

## 2022-04-19 DIAGNOSIS — Z8249 Family history of ischemic heart disease and other diseases of the circulatory system: Secondary | ICD-10-CM | POA: Insufficient documentation

## 2022-04-19 NOTE — Assessment & Plan Note (Signed)
Osteoporosis: Had a fall December 2023, had another fall in May 2023.  Osteoporosis managed elsewhere, she was rec to proceed with Prolia.  We will check BMP, TSH and vitamin D (she takes OTC calcium/vitamin D daily). DJD: Fortunately doing well, on Tylenol as needed Migraines: Hardly ever has episode since she become  menopausal. History of his skin cancer: Sees dermatology regularly. FH CAD Brother: Laurie Johns's last LDL 130, although her risk is only 6.7%, that does not take in account her family history.  Options:, Ca+ Coronary CT or start a low-dose of a statin.  Patient will think about it. Preventive care: Discussed RTC 1 year depending on results.

## 2022-04-19 NOTE — Assessment & Plan Note (Signed)
-  Td 2015 - Pnm shot 2016 -prevnar 2017; PNM 20: 2023 -zostavax 02-2016 -S/p shingrex x 2 - s/p  RSV  - covid vax : utd -- had a flu shot  -CCS: Cscope 2006, repeated cscope 10-2014 no polyps, 10 years per report - Female care: used to see  Dr Edwyna Ready, rec to call them ;  MMG: 04-2021 ([facility]) Pap smear 10/20/2017 - POA d/w pt

## 2022-04-20 ENCOUNTER — Encounter: Payer: Self-pay | Admitting: Internal Medicine

## 2022-04-20 DIAGNOSIS — Z8249 Family history of ischemic heart disease and other diseases of the circulatory system: Secondary | ICD-10-CM

## 2022-04-20 NOTE — Telephone Encounter (Signed)
Order placed

## 2022-04-20 NOTE — Telephone Encounter (Signed)
Arrange a coronary CT, DX FH CAD

## 2022-04-20 NOTE — Addendum Note (Signed)
Addended byDamita Dunnings D on: 04/20/2022 04:30 PM   Modules accepted: Orders

## 2022-04-21 ENCOUNTER — Encounter: Payer: Self-pay | Admitting: Internal Medicine

## 2022-04-21 LAB — HM MAMMOGRAPHY

## 2022-05-04 ENCOUNTER — Ambulatory Visit (HOSPITAL_BASED_OUTPATIENT_CLINIC_OR_DEPARTMENT_OTHER)
Admission: RE | Admit: 2022-05-04 | Discharge: 2022-05-04 | Disposition: A | Discharge status: Home / Self Care | Payer: Medicare Other | Source: Ambulatory Visit | Attending: Internal Medicine | Admitting: Internal Medicine

## 2022-05-04 DIAGNOSIS — Z8249 Family history of ischemic heart disease and other diseases of the circulatory system: Secondary | ICD-10-CM | POA: Insufficient documentation

## 2022-10-14 ENCOUNTER — Telehealth: Payer: Self-pay | Admitting: Internal Medicine

## 2022-10-14 NOTE — Telephone Encounter (Signed)
Contacted Landry Dyke to schedule their annual wellness visit. Appointment made for 11/10/2022.  Verlee Rossetti; Care Guide Ambulatory Clinical Support [place] [facility] Direct Dial: [phone]

## 2022-10-21 ENCOUNTER — Ambulatory Visit (INDEPENDENT_AMBULATORY_CARE_PROVIDER_SITE_OTHER): Payer: Medicare Other

## 2022-10-21 ENCOUNTER — Ambulatory Visit
Admission: EM | Admit: 2022-10-21 | Discharge: 2022-10-21 | Disposition: A | Discharge status: Home / Self Care | Payer: Medicare Other | Attending: Urgent Care | Admitting: Urgent Care

## 2022-10-21 DIAGNOSIS — B9789 Other viral agents as the cause of diseases classified elsewhere: Secondary | ICD-10-CM

## 2022-10-21 DIAGNOSIS — J988 Other specified respiratory disorders: Secondary | ICD-10-CM

## 2022-10-21 MED ORDER — IPRATROPIUM BROMIDE 0.03 % NA SOLN: 2.0000 | Freq: Two times a day (BID) | NASAL | 0 refills | Status: DC

## 2022-10-21 MED ORDER — PROMETHAZINE-DM 6.25-15 MG/5ML PO SYRP: 2.5000 mL | ORAL_SOLUTION | Freq: Three times a day (TID) | ORAL | 0 refills | Status: DC | PRN

## 2022-10-21 MED ORDER — CETIRIZINE HCL 10 MG PO TABS: 10.0000 mg | ORAL_TABLET | Freq: Every day | ORAL | 0 refills | Status: DC

## 2022-10-21 NOTE — ED Provider Notes (Signed)
[facility]RG[facility]  Note:  This document was prepared using Conservation officer, historic buildings and may include unintentional dictation errors.  MRN: [id number] DOB: 01-Jul-1955  Subjective:   Laurie Johns is a 67 y.o. female presenting for 5-day history of a productive cough, sinus congestion and drainage.  Has been using Mucinex with some relief.  Has also used no sprays.  Had a COVID test done and was negative.  She has significant concern about her lungs given history of bilateral pneumonia, collapsed lung.  Has not happened in years but wants to make sure she is okay as she is traveling on a long camping trip starting tomorrow.  She does have a history of asthma.  Does not need an albuterol inhaler.  Does not want to use prednisone or pseudoephedrine.  No smoking of any kind including cigarettes, cigars, vaping, marijuana use.    No current facility-administered medications for this encounter.  Current Outpatient Medications:    acetaminophen (TYLENOL) 500 MG tablet, Take 1,000 mg by mouth every 6 (six) hours as needed for moderate pain. (Patient not taking: Reported on 04/17/2022), Disp: , Rfl:    Calcium Carb-Cholecalciferol (CALCIUM 600 + D PO), Take 1 tablet by mouth 2 (two) times daily. , Disp: , Rfl:    denosumab (PROLIA) 60 MG/ML SOSY injection, Inject 60 mg into the skin every 6 (six) months., Disp: , Rfl:    EPINEPHrine 0.3 mg/0.3 mL IJ SOAJ injection, INJECT CONTENTS OF 1 PEN AS NEEDED FOR ALLERGIC REACTION (Patient not taking: Reported on 04/17/2022), Disp: 2 each, Rfl: 3   FIBER PO, Take 2 tablets by mouth daily., Disp: , Rfl:    glucosamine-chondroitin 500-400 MG tablet, Take 1 tablet by mouth every other day. , Disp: , Rfl:    Loratadine (KS ALLERCLEAR PO), Take 1 tablet by mouth daily., Disp: , Rfl:    Melatonin 10 MG CAPS, , Disp: , Rfl:    Multiple Vitamins-Minerals (CENTRUM SILVER PO), Take 1 tablet by mouth daily., Disp: , Rfl:    Omega-3 Fatty Acids  (FISH OIL) 1000 MG CAPS, Take 2 capsules by mouth daily., Disp: , Rfl:    SUMAtriptan (IMITREX) 50 MG tablet, Take 1 tablet (50 mg total) by mouth once as needed for migraine. 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      Family History  Problem Relation Age of Onset   Dementia Mother    Hypertension Mother    Hypothyroidism Mother    Hyperlipidemia Mother  Osteoporosis Mother    Osteoporosis Father    Hypertension Father    Osteoporosis Sister    Hypothyroidism Sister    Colon polyps Sister    Heart disease Brother        dx in his 98s   Hypertension Brother    Diabetes Maternal Grandmother    Polycystic kidney disease Other    COPD Neg Hx    Colon cancer Neg Hx    Breast cancer Neg Hx    Esophageal cancer Neg Hx    Stomach cancer Neg Hx    Rectal cancer Neg Hx     Social History   Tobacco Use   Smoking status: Never   Smokeless tobacco: Never  Substance Use Topics   Alcohol use: Yes    Alcohol/week: 7.0 standard drinks of alcohol    Types: 7 Glasses of wine per week   Drug use: No    ROS   Objective:   Vitals: BP (!) 141/82 (BP Location: Left Arm)   Pulse 88   Resp 18   SpO2 98%   Physical Exam Constitutional:       General: She is not in acute distress.    Appearance: Normal appearance. She is well-developed and normal weight. She is not ill-appearing, toxic-appearing or diaphoretic.  HENT:     Head: Normocephalic and atraumatic.     Right Ear: Tympanic membrane, ear canal and external ear normal. No drainage or tenderness. No middle ear effusion. There is no impacted cerumen. Tympanic membrane is not erythematous or bulging.     Left Ear: Tympanic membrane, ear canal and external ear normal. No drainage or tenderness.  No middle ear effusion. There is no impacted cerumen. Tympanic membrane is not erythematous or bulging.     Nose: Nose normal. No congestion or rhinorrhea.     Mouth/Throat:     Mouth: Mucous membranes are moist. No oral lesions.     Pharynx: No pharyngeal swelling, oropharyngeal exudate, posterior oropharyngeal erythema or uvula swelling.     Tonsils: No tonsillar exudate or tonsillar abscesses.  Eyes:     General: No scleral icterus.       Right eye: No discharge.        Left eye: No discharge.     Extraocular Movements: Extraocular movements intact.     Right eye: Normal extraocular motion.     Left eye: Normal extraocular motion.     Conjunctiva/sclera: Conjunctivae normal.  Cardiovascular:     Rate and Rhythm: Normal rate and regular rhythm.     Heart sounds: Normal heart sounds. No murmur heard.    No friction rub. No gallop.  Pulmonary:     Effort: Pulmonary effort is normal. No respiratory distress.     Breath sounds: No stridor. No wheezing, rhonchi or rales.  Chest:     Chest wall: No tenderness.  Musculoskeletal:     Cervical back: Normal range of motion and neck supple.  Lymphadenopathy:     Cervical: No cervical adenopathy.  Skin:    General: Skin is warm and dry.  Neurological:     General: No focal deficit present.     Mental Status: She is alert and oriented to person, place, and time.  Psychiatric:        Mood and Affect: Mood normal.        Behavior:  Behavior normal.     DG Chest 2 View  Result Date: 10/21/2022 CLINICAL DATA:  Cough and wheezing for 1  day. EXAM: CHEST - 2 VIEW COMPARISON:  06/06/2014 FINDINGS: The heart size and mediastinal contours are within normal limits. Both lungs are clear. Mild hyperinflation noted, suspicious for COPD. The visualized skeletal structures are unremarkable. IMPRESSION: No active cardiopulmonary disease. Suspect COPD. Electronically Signed   By: Danae Orleans M.D.   On: 10/21/2022 13:00     Assessment and Plan :   PDMP not reviewed this encounter.  1. Viral respiratory illness    Chest x-ray negative.  Suspect viral URI, viral syndrome. Physical exam findings reassuring and vital signs stable for discharge. Advised supportive care, offered symptomatic relief. Counseled patient on potential for adverse effects with medications prescribed/recommended today, ER and return-to-clinic precautions discussed, patient verbalized understanding.     Wallis Bamberg, PA-C 10/21/22 1310

## 2022-10-21 NOTE — Discharge Instructions (Addendum)
We will manage this as a viral syndrome. For sore throat or cough try using a honey-based tea. Use 3 teaspoons of honey with juice squeezed from half lemon. Place shaved pieces of ginger into 1/2-1 cup of water and warm over stove top. Then mix the ingredients and repeat every 4 hours as needed. Please take Tylenol 500mg -650mg  once every 6 hours for fevers, aches and pains. Hydrate very well with at least 2 liters (64 ounces) of water. Eat light meals such as soups (chicken and noodles, chicken wild rice, vegetable).  Do not eat any foods that you are allergic to.  Start an antihistamine like Zyrtec (10mg  daily) for postnasal drainage, sinus congestion.  You can take this together with Atrovent nasal spray 3 times a day or twice daily as needed for the same kind of congestion.  Use cough medications as needed.

## 2022-10-21 NOTE — ED Triage Notes (Signed)
Pt reports cough x 1 day; nasal congestion x 5 days. Mucinex gives some relief.   Negative COVID test on 10/20/22.   Pt reports she had pneumonia and she will ho camping tomorrow 1 week and theres not Healthcare were she is going.

## 2022-11-02 ENCOUNTER — Encounter: Payer: Self-pay | Admitting: Internal Medicine

## 2022-11-10 ENCOUNTER — Ambulatory Visit (INDEPENDENT_AMBULATORY_CARE_PROVIDER_SITE_OTHER): Payer: Medicare Other | Admitting: *Deleted

## 2022-11-10 DIAGNOSIS — Z0000 Encounter for general adult medical examination without abnormal findings: Secondary | ICD-10-CM | POA: Diagnosis not present

## 2022-11-10 NOTE — Patient Instructions (Signed)
Laurie Johns , Thank you for taking time to come for your Medicare Wellness Visit. I appreciate your ongoing commitment to your health goals. Please review the following plan we discussed and let me know if I can assist you in the future.     This is a list of the screening recommended for you and due dates:  Health Maintenance  Topic Date Due   DTaP/Tdap/Td vaccine (2 - Td or Tdap) 08/09/2022   COVID-19 Vaccine (7 - 2023-24 season) 03/19/2023*   Flu Shot  01/07/2023   Mammogram  04/22/2023   Medicare Annual Wellness Visit  11/10/2023   Colon Cancer Screening  02/26/2029   Pneumonia Vaccine  Completed   DEXA scan (bone density measurement)  Completed   Hepatitis C Screening  Completed   Zoster (Shingles) Vaccine  Completed   HPV Vaccine  Aged Out  *Topic was postponed. The date shown is not the original due date.    Next appointment: Follow up in one year for your annual wellness visit.   Preventive Care 69 Years and Older, Female Preventive care refers to lifestyle choices and visits with your health care provider that can promote health and wellness. What does preventive care include? A yearly physical exam. This is also called an annual well check. Dental exams once or twice a year. Routine eye exams. Ask your health care provider how often you should have your eyes checked. Personal lifestyle choices, including: Daily care of your teeth and gums. Regular physical activity. Eating a healthy diet. Avoiding tobacco and drug use. Limiting alcohol use. Practicing safe sex. Taking low-dose aspirin every day. Taking vitamin and mineral supplements as recommended by your health care provider. What happens during an annual well check? The services and screenings done by your health care provider during your annual well check will depend on your age, overall health, lifestyle risk factors, and family history of disease. Counseling  Your health care provider may ask you questions  about your: Alcohol use. Tobacco use. Drug use. Emotional well-being. Home and relationship well-being. Sexual activity. Eating habits. History of falls. Memory and ability to understand (cognition). Work and work Astronomer. Reproductive health. Screening  You may have the following tests or measurements: Height, weight, and BMI. Blood pressure. Lipid and cholesterol levels. These may be checked every 5 years, or more frequently if you are over 58 years old. Skin check. Lung cancer screening. You may have this screening every year starting at age 69 if you have a 30-pack-year history of smoking and currently smoke or have quit within the past 15 years. Fecal occult blood test (FOBT) of the stool. You may have this test every year starting at age 64. Flexible sigmoidoscopy or colonoscopy. You may have a sigmoidoscopy every 5 years or a colonoscopy every 10 years starting at age 69 Hepatitis C blood test. Hepatitis B blood test. Sexually transmitted disease (STD) testing. Diabetes screening. This is done by checking your blood sugar (glucose) after you have not eaten for a while (fasting). You may have this done every 1-3 years. Bone density scan. This is done to screen for osteoporosis. You may have this done starting at age 69 Mammogram. This may be done every 1-2 years. Talk to your health care provider about how often you should have regular mammograms. Talk with your health care provider about your test results, treatment options, and if necessary, the need for more tests. Vaccines  Your health care provider may recommend certain vaccines, such as: Influenza vaccine.  This is recommended every year. Tetanus, diphtheria, and acellular pertussis (Tdap, Td) vaccine. You may need a Td booster every 10 years. Zoster vaccine. You may need this after age 69 Pneumococcal 13-valent conjugate (PCV13) vaccine. One dose is recommended after age 69 Pneumococcal polysaccharide (PPSV23)  vaccine. One dose is recommended after age 69 Talk to your health care provider about which screenings and vaccines you need and how often you need them. This information is not intended to replace advice given to you by your health care provider. Make sure you discuss any questions you have with your health care provider. Document Released: 06/21/2015 Document Revised: 02/12/2016 Document Reviewed: 03/26/2015 Elsevier Interactive Patient Education  2017 [employer].  Fall Prevention in the Home Falls can cause injuries. They can happen to people of all ages. There are many things you can do to make your home safe and to help prevent falls. What can I do on the outside of my home? Regularly fix the edges of walkways and driveways and fix any cracks. Remove anything that might make you trip as you walk through a door, such as a raised step or threshold. Trim any bushes or trees on the path to your home. Use bright outdoor lighting. Clear any walking paths of anything that might make someone trip, such as rocks or tools. Regularly check to see if handrails are loose or broken. Make sure that both sides of any steps have handrails. Any raised decks and porches should have guardrails on the edges. Have any leaves, snow, or ice cleared regularly. Use sand or salt on walking paths during winter. Clean up any spills in your garage right away. This includes oil or grease spills. What can I do in the bathroom? Use night lights. Install grab bars by the toilet and in the tub and shower. Do not use towel bars as grab bars. Use non-skid mats or decals in the tub or shower. If you need to sit down in the shower, use a plastic, non-slip stool. Keep the floor dry. Clean up any water that spills on the floor as soon as it happens. Remove soap buildup in the tub or shower regularly. Attach bath mats securely with double-sided non-slip rug tape. Do not have throw rugs and other things on the floor that  can make you trip. What can I do in the bedroom? Use night lights. Make sure that you have a light by your bed that is easy to reach. Do not use any sheets or blankets that are too big for your bed. They should not hang down onto the floor. Have a firm chair that has side arms. You can use this for support while you get dressed. Do not have throw rugs and other things on the floor that can make you trip. What can I do in the kitchen? Clean up any spills right away. Avoid walking on wet floors. Keep items that you use a lot in easy-to-reach places. If you need to reach something above you, use a strong step stool that has a grab bar. Keep electrical cords out of the way. Do not use floor polish or wax that makes floors slippery. If you must use wax, use non-skid floor wax. Do not have throw rugs and other things on the floor that can make you trip. What can I do with my stairs? Do not leave any items on the stairs. Make sure that there are handrails on both sides of the stairs and use them. Fix handrails  that are broken or loose. Make sure that handrails are as long as the stairways. Check any carpeting to make sure that it is firmly attached to the stairs. Fix any carpet that is loose or worn. Avoid having throw rugs at the top or bottom of the stairs. If you do have throw rugs, attach them to the floor with carpet tape. Make sure that you have a light switch at the top of the stairs and the bottom of the stairs. If you do not have them, ask someone to add them for you. What else can I do to help prevent falls? Wear shoes that: Do not have high heels. Have rubber bottoms. Are comfortable and fit you well. Are closed at the toe. Do not wear sandals. If you use a stepladder: Make sure that it is fully opened. Do not climb a closed stepladder. Make sure that both sides of the stepladder are locked into place. Ask someone to hold it for you, if possible. Clearly mark and make sure that you  can see: Any grab bars or handrails. First and last steps. Where the edge of each step is. Use tools that help you move around (mobility aids) if they are needed. These include: Canes. Walkers. Scooters. Crutches. Turn on the lights when you go into a dark area. Replace any light bulbs as soon as they burn out. Set up your furniture so you have a clear path. Avoid moving your furniture around. If any of your floors are uneven, fix them. If there are any pets around you, be aware of where they are. Review your medicines with your doctor. Some medicines can make you feel dizzy. This can increase your chance of falling. Ask your doctor what other things that you can do to help prevent falls. This information is not intended to replace advice given to you by your health care provider. Make sure you discuss any questions you have with your health care provider. Document Released: 03/21/2009 Document Revised: 10/31/2015 Document Reviewed: 06/29/2014 Elsevier Interactive Patient Education  2017 [employer].

## 2022-11-10 NOTE — Progress Notes (Signed)
Subjective:   Laurie Johns is a 67 y.o. female who presents for Medicare Annual (Subsequent) preventive examination.  I connected with  Landry Dyke on 11/10/22 by a audio enabled telemedicine application and verified that I am speaking with the correct person using two identifiers.  Patient Location: Home  Provider Location: Office/Clinic  I discussed the limitations of evaluation and management by telemedicine. The patient expressed understanding and agreed to proceed.   Review of Systems     Cardiac Risk Factors include: advanced age (>35men, >58 women)     Objective:    There were no vitals filed for this visit. There is no height or weight on file to calculate BMI.     11/10/2022    9:41 AM 10/27/2021   12:35 PM 06/06/2014    9:53 AM  Advanced Directives  Does Patient Have a Medical Advance Directive? Yes Yes No  Type of Advance Directive Living will Living will     Current Medications (verified) Outpatient Encounter Medications as of 11/10/2022  Medication Sig   acetaminophen (TYLENOL) 500 MG tablet Take 1,000 mg by mouth every 6 (six) hours as needed for moderate pain. (Patient not taking: Reported on 04/17/2022)   Calcium Carb-Cholecalciferol (CALCIUM 600 + D PO) Take 1 tablet by mouth 2 (two) times daily.    cetirizine (ZYRTEC ALLERGY) 10 MG tablet Take 1 tablet (10 mg total) by mouth daily.   denosumab (PROLIA) 60 MG/ML SOSY injection Inject 60 mg into the skin every 6 (six) months.   EPINEPHrine 0.3 mg/0.3 mL IJ SOAJ injection INJECT CONTENTS OF 1 PEN AS NEEDED FOR ALLERGIC REACTION (Patient not taking: Reported on 04/17/2022)   FIBER PO Take 2 tablets by mouth daily.   glucosamine-chondroitin 500-400 MG tablet Take 1 tablet by mouth every other day.    ipratropium (ATROVENT) 0.03 % nasal spray Place 2 sprays into both nostrils 2 (two) times daily.   Loratadine (KS ALLERCLEAR PO) Take 1 tablet by mouth daily.   Melatonin 10 MG CAPS    Multiple  Vitamins-Minerals (CENTRUM SILVER PO) Take 1 tablet by mouth daily.   Omega-3 Fatty Acids (FISH OIL) 1000 MG CAPS Take 2 capsules by mouth daily.   promethazine-dextromethorphan (PROMETHAZINE-DM) 6.25-15 MG/5ML syrup Take 2.5 mLs by mouth 3 (three) times daily as needed for cough.   SUMAtriptan (IMITREX) 50 MG tablet Take 1 tablet (50 mg total) by mouth once as needed for migraine. (Patient not taking: Reported on 06/04/2021)   TURMERIC PO Take by mouth daily.   No facility-administered encounter medications on file as of 11/10/2022.    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     Family History  Problem Relation Age of Onset   Dementia Mother    Hypertension Mother  Hypothyroidism Mother    Hyperlipidemia Mother    Osteoporosis Mother    Osteoporosis Father    Hypertension Father    Osteoporosis Sister    Hypothyroidism Sister    Colon polyps Sister    Heart disease Brother        dx in his 68s   Hypertension Brother    Diabetes Maternal Grandmother    Polycystic kidney disease Other    COPD Neg Hx    Colon cancer Neg Hx    Breast cancer Neg Hx    Esophageal cancer Neg Hx    Stomach cancer Neg Hx    Rectal cancer Neg Hx    Social History   Socioeconomic History   Marital status: Married    Spouse name: Not on file   Number of children: 2   Years of education: 16   Highest education level: Not on file  Occupational History    Occupation: photographer,retired   Tobacco Use   Smoking status: Never   Smokeless tobacco: Never  Substance and Sexual Activity   Alcohol use: Yes    Alcohol/week: 7.0 standard drinks of alcohol    Types: 7 Glasses of wine per week   Drug use: No   Sexual activity: Yes    Partners: Male  Other Topics Concern   Not on file  Social History Narrative   Household- pt and husband   HSG, [place].Married '80 Children: 2 sons; '84 and '87; 2 grandchildren.    Hydrologist.  retired.    Son w/ Bipolar doing ok as off 12/2017       Social Determinants of Health   Financial Resource Strain: Low Risk  (10/27/2021)   Overall Financial Resource Strain (CARDIA)    Difficulty of Paying Living Expenses: Not hard at all  Food Insecurity: No Food Insecurity (11/10/2022)   Hunger Vital Sign    Worried About Running Out of Food in the Last Year: Never true    Ran Out of Food in the Last Year: Never true  Transportation Needs: No Transportation Needs (11/10/2022)   PRAPARE - Administrator, Civil Service (Medical): No    Lack of Transportation (Non-Medical): No  Physical Activity: Insufficiently Active (10/27/2021)   Exercise Vital Sign    Days of Exercise per Week: 3 days    Minutes of Exercise per Session: 30 min  Stress: No Stress Concern Present (10/27/2021)   [employer] of Occupational Health - Occupational Stress Questionnaire    Feeling of Stress : Not at all  Social Connections: Moderately Integrated (10/27/2021)   Social Connection and Isolation Panel [NHANES]    Frequency of Communication with Friends and Family: More than three times a week    Frequency of Social Gatherings with Friends and Family: Twice a week    Attends Religious Services: Never    Database administrator or Organizations: Yes    Attends Engineer, structural: More than 4 times per year    Marital Status: Married    Tobacco Counseling Counseling given: Not  Answered   Clinical Intake:  Pre-visit preparation completed: Yes  Pain : No/denies pain  Nutritional Risks: None Diabetes: No  How often do you need to have someone help you when you read instructions, pamphlets, or other written materials from your doctor or pharmacy?: 1 - Never   Activities of Daily Living    11/10/2022    9:46 AM  In your present state of health, do you have  any difficulty performing the following activities:  Hearing? 0  Vision? 0  Comment wears readers  Difficulty concentrating or making decisions? 0  Walking or climbing stairs? 0  Dressing or bathing? 0  Doing errands, shopping? 0  Preparing Food and eating ? N  Using the Toilet? N  In the past six months, have you accidently leaked urine? Y  Comment had one episode of stress incontinence  Do you have problems with loss of bowel control? N  Managing your Medications? N  Managing your Finances? N  Housekeeping or managing your Housekeeping? N    Patient Care Team: Wanda Plump, MD as PCP - General (Internal Medicine) Olivia Mackie, MD as Consulting Physician (Obstetrics and Gynecology) Melvenia Needles, MD as Consulting Physician (Ophthalmology) Donzetta Starch, MD as Consulting Physician (Dermatology)  Indicate any recent Medical Services you may have received from other than [facility] providers in the past year (date may be approximate).     Assessment:   This is a routine wellness examination for [place].  Hearing/Vision screen No results found.  Dietary issues and exercise activities discussed: Current Exercise Habits: The patient does not participate in regular exercise at present (exercises sporadically), Exercise limited by: None identified   Goals Addressed   None    Depression Screen    11/10/2022    9:45 AM 04/17/2022    9:06 AM 10/27/2021   12:39 PM 04/16/2021   10:07 AM 04/11/2020   10:57 AM 01/31/2019    2:58 PM 01/04/2018    9:54 AM  PHQ 2/9 Scores  PHQ - 2 Score 0 0 0 0 0 0 0     Fall Risk    11/10/2022    9:43 AM 04/17/2022    9:06 AM 10/27/2021   12:38 PM 04/16/2021   10:07 AM 04/11/2020   10:57 AM  Fall Risk   Falls in the past year? 0 1 1 0 0  Number falls in past yr: 0 0 0 0 0  Injury with Fall? 0 1 1 0 0  Risk for fall due to : No Fall Risks  History of fall(s)    Follow up Falls evaluation completed Falls evaluation completed Falls prevention discussed Falls evaluation completed Falls evaluation completed    FALL RISK PREVENTION PERTAINING TO THE HOME:  Any stairs in or around the home? Yes  If so, are there any without handrails? No  Home free of loose throw rugs in walkways, pet beds, electrical cords, etc? Yes  Adequate lighting in your home to reduce risk of falls? Yes   ASSISTIVE DEVICES UTILIZED TO PREVENT FALLS:  Life alert? No  Use of a cane, walker or w/c? No  Grab bars in the bathroom? No  Shower chair or bench in shower?  Built in seat Elevated toilet seat or a handicapped toilet? No   TIMED UP AND GO:  Was the test performed?  No, audio visit .   Cognitive Function:        11/10/2022    9:53 AM  6CIT Screen  What Year? 0 points  What month? 0 points  What time? 0 points  Count back from 20 0 points  Months in reverse 0 points  Repeat phrase 0 points  Total Score 0 points    Immunizations Immunization History  Administered Date(s) Administered   Fluad Quad(high Dose 65+) 01/31/2019   Influenza Whole 06/24/2012   Influenza, High Dose Seasonal PF 04/01/2020   Influenza,inj,Quad PF,6+ Mos 03/07/2014, 02/26/2016, 04/15/2018  Influenza-Unspecified 03/17/2017, 02/18/2021, 02/27/2022   PFIZER Comirnaty(Gray Top)Covid-19 Tri-Sucrose Vaccine 10/10/2020   PFIZER(Purple Top)SARS-COV-2 Vaccination 07/03/2019, 07/24/2019, 04/01/2020, 02/18/2021   PNEUMOCOCCAL CONJUGATE-20 10/29/2021   Pfizer Covid-19 Vaccine Bivalent Booster 68yrs & up 02/27/2022   Pneumococcal Conjugate-13 11/20/2015   Pneumococcal Polysaccharide-23 08/27/2014    Respiratory Syncytial Virus Vaccine,Recomb Aduvanted(Arexvy) 03/31/2022   Tdap 08/08/2012   Zoster Recombinat (Shingrix) 01/04/2018, 04/19/2018   Zoster, Live 02/26/2016    TDAP status: Due, Education has been provided regarding the importance of this vaccine. Advised may receive this vaccine at local pharmacy or Health Dept. Aware to provide a copy of the vaccination record if obtained from local pharmacy or Health Dept. Verbalized acceptance and understanding.  Flu Vaccine status: Up to date  Pneumococcal vaccine status: Up to date  Covid-19 vaccine status: Information provided on how to obtain vaccines.   Qualifies for Shingles Vaccine? Yes   Zostavax completed Yes   Shingrix Completed?: Yes  Screening Tests Health Maintenance  Topic Date Due   DTaP/Tdap/Td (2 - Td or Tdap) 08/09/2022   Medicare Annual Wellness (AWV)  10/28/2022   COVID-19 Vaccine (7 - 2023-24 season) 03/19/2023 (Originally 04/24/2022)   INFLUENZA VACCINE  01/07/2023   MAMMOGRAM  04/22/2023   Colonoscopy  02/26/2029   Pneumonia Vaccine 85+ Years old  Completed   DEXA SCAN  Completed   Hepatitis C Screening  Completed   Zoster Vaccines- Shingrix  Completed   HPV VACCINES  Aged Out    Health Maintenance  Health Maintenance Due  Topic Date Due   DTaP/Tdap/Td (2 - Td or Tdap) 08/09/2022   Medicare Annual Wellness (AWV)  10/28/2022    Colorectal cancer screening: Type of screening: Colonoscopy. Completed 02/27/19. Repeat every 10 years  Mammogram status: Completed 04/21/22. Repeat every year  Bone Density status: Completed 02/25/21. Results reflect: Bone density results: OSTEOPOROSIS. Repeat every 2 years.  Lung Cancer Screening: (Low Dose CT Chest recommended if Age 69-80 years, 30 pack-year currently smoking OR have quit w/in 15years.) does not qualify.   Additional Screening:  Hepatitis C Screening: does qualify; Completed 12/29/16  Vision Screening: Recommended annual ophthalmology exams for  early detection of glaucoma and other disorders of the eye. Is the patient up to date with their annual eye exam?  Yes  Who is the provider or what is the name of the office in which the patient attends annual eye exams? [facility] - Dr. Burgess Estelle If pt is not established with a provider, would they like to be referred to a provider to establish care? No .   Dental Screening: Recommended annual dental exams for proper oral hygiene  Community Resource Referral / Chronic Care Management: CRR required this visit?  No   CCM required this visit?  No      Plan:     I have personally reviewed and noted the following in the patient's chart:   Medical and social history Use of alcohol, tobacco or illicit drugs  Current medications and supplements including opioid prescriptions. Patient is not currently taking opioid prescriptions. Functional ability and status Nutritional status Physical activity Advanced directives List of other physicians Hospitalizations, surgeries, and ER visits in previous 12 months Vitals Screenings to include cognitive, depression, and falls Referrals and appointments  In addition, I have reviewed and discussed with patient certain preventive protocols, quality metrics, and best practice recommendations. A written personalized care plan for preventive services as well as general preventive health recommendations were provided to patient.   Due to this being  a telephonic visit, the after visit summary with patients personalized plan was offered to patient via mail or my-chart. Patient would like to access on my-chart.  Donne Anon, [place]   11/10/2022   Nurse Notes: None

## 2023-04-21 ENCOUNTER — Other Ambulatory Visit (HOSPITAL_BASED_OUTPATIENT_CLINIC_OR_DEPARTMENT_OTHER): Payer: Self-pay

## 2023-04-21 ENCOUNTER — Encounter: Payer: Self-pay | Admitting: Internal Medicine

## 2023-04-21 ENCOUNTER — Ambulatory Visit: Payer: Medicare Other | Admitting: Internal Medicine

## 2023-04-21 VITALS — BP 120/66 | HR 68 | Temp 98.2°F | Resp 16 | Ht 62.0 in | Wt 135.0 lb

## 2023-04-21 DIAGNOSIS — M810 Age-related osteoporosis without current pathological fracture: Secondary | ICD-10-CM | POA: Diagnosis not present

## 2023-04-21 DIAGNOSIS — Z8249 Family history of ischemic heart disease and other diseases of the circulatory system: Secondary | ICD-10-CM | POA: Diagnosis not present

## 2023-04-21 DIAGNOSIS — E785 Hyperlipidemia, unspecified: Secondary | ICD-10-CM

## 2023-04-21 LAB — COMPREHENSIVE METABOLIC PANEL
ALT: 15 U/L (ref 0–35)
AST: 19 U/L (ref 0–37)
Albumin: 4.4 g/dL (ref 3.5–5.2)
Alkaline Phosphatase: 58 U/L (ref 39–117)
BUN: 25 mg/dL — ABNORMAL HIGH (ref 6–23)
CO2: 28 meq/L (ref 19–32)
Calcium: 9.5 mg/dL (ref 8.4–10.5)
Chloride: 100 meq/L (ref 96–112)
Creatinine, Ser: 0.95 mg/dL (ref 0.40–1.20)
GFR: 61.19 mL/min (ref >60.00)
Glucose, Bld: 88 mg/dL (ref 70–99)
Potassium: 4.5 meq/L (ref 3.5–5.1)
Sodium: 137 meq/L (ref 135–145)
Total Bilirubin: 0.5 mg/dL (ref 0.2–1.2)
Total Protein: 7 g/dL (ref 6.0–8.3)

## 2023-04-21 LAB — CBC WITH DIFFERENTIAL/PLATELET
Basophils Absolute: 0 10*3/uL (ref 0.0–0.1)
Basophils Relative: 0.4 % (ref 0.0–3.0)
Eosinophils Absolute: 0.1 10*3/uL (ref 0.0–0.7)
Eosinophils Relative: 2.5 % (ref 0.0–5.0)
HCT: 44.7 % (ref 36.0–46.0)
Hemoglobin: 14.7 g/dL (ref 12.0–15.0)
Lymphocytes Relative: 41 % (ref 12.0–46.0)
Lymphs Abs: 2.2 10*3/uL (ref 0.7–4.0)
MCHC: 32.9 g/dL (ref 30.0–36.0)
MCV: 92.7 fL (ref 78.0–100.0)
Monocytes Absolute: 0.5 10*3/uL (ref 0.1–1.0)
Monocytes Relative: 9.8 % (ref 3.0–12.0)
Neutro Abs: 2.4 10*3/uL (ref 1.4–7.7)
Neutrophils Relative %: 46.3 % (ref 43.0–77.0)
Platelets: 228 10*3/uL (ref 150.0–400.0)
RBC: 4.82 Mil/uL (ref 3.87–5.11)
RDW: 13.5 % (ref 11.5–15.5)
WBC: 5.3 10*3/uL (ref 4.0–10.5)

## 2023-04-21 LAB — VITAMIN D 25 HYDROXY (VIT D DEFICIENCY, FRACTURES): VITD: 69.05 ng/mL (ref 30.00–100.00)

## 2023-04-21 LAB — LIPID PANEL
Cholesterol: 237 mg/dL — ABNORMAL HIGH (ref 0–200)
HDL: 65.6 mg/dL (ref >39.00)
LDL Cholesterol: 153 mg/dL — ABNORMAL HIGH (ref 0–99)
NonHDL: 171
Total CHOL/HDL Ratio: 4
Triglycerides: 88 mg/dL (ref 0.0–149.0)
VLDL: 17.6 mg/dL (ref 0.0–40.0)

## 2023-04-21 MED ORDER — TETANUS-DIPHTH-ACELL PERTUSSIS 5-2.5-18.5 LF-MCG/0.5 IM SUSY
0.5000 mL | PREFILLED_SYRINGE | Freq: Once | INTRAMUSCULAR | 0 refills | Status: AC
  Filled 2023-04-21: qty 0.5, 1d supply, fill #0

## 2023-04-21 NOTE — Patient Instructions (Addendum)
Vaccines I recommend:  Tdap (tetanus)      GO TO THE LAB : Get the blood work     Next visit with me in 1 year Please schedule it at the front desk

## 2023-04-21 NOTE — Progress Notes (Signed)
Subjective:    Patient ID: Laurie Johns, female    DOB: 01/23/1956, 67 y.o.   MRN: [id number]  DOS:  04/21/2023 Type of visit - description: rov Routine office visit. Chronic medical problems were addressed. COPD?  Had a chest x-ray 10/2022, some evidence of COPD.  The patient has occasional cough in the mornings with clear sputum but denies wheezing, SOB, DOE.   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      Current Outpatient Medications  Medication Instructions   acetaminophen (TYLENOL) 1,000 mg, Every 6 hours PRN   Calcium Carb-Cholecalciferol (CALCIUM 600 + D PO) 1 tablet, Oral, 2 times daily   cetirizine (ZYRTEC ALLERGY) 10 mg, Oral, Daily   EPINEPHrine 0.3 mg/0.3 mL IJ SOAJ injection INJECT CONTENTS OF 1 PEN AS NEEDED FOR ALLERGIC REACTION   FIBER PO 2 tablets, Daily   glucosamine-chondroitin 500-400 MG tablet 1 tablet, Oral, Every other day   ipratropium (ATROVENT) 0.03 % nasal spray 2 sprays, Each Nare, 2 times daily   Loratadine (KS ALLERCLEAR PO) 1 tablet, Daily   Melatonin 10 MG CAPS No dose, route, or frequency recorded.   Multiple Vitamins-Minerals (CENTRUM SILVER PO) 1 tablet, Daily   Omega-3 Fatty Acids (FISH OIL) 1000 MG CAPS 2 capsules, Daily   Prolia 60 mg, Subcutaneous, Every 6 months   promethazine-dextromethorphan (PROMETHAZINE-DM) 6.25-15 MG/5ML syrup 2.5 mLs, Oral, 3 times daily PRN   SUMAtriptan (IMITREX) 50 mg, Oral, Once PRN   TURMERIC PO  Oral, Daily       Objective:   Physical Exam BP 120/66   Pulse 68   Temp 98.2 F (36.8 C) (Oral)   Resp 16   Ht 5\' 2"  (1.575 m)   Wt 135 lb (61.2 kg)   SpO2 97%   BMI 24.69 kg/m  General: Well developed, NAD, BMI noted Neck: No  thyromegaly  HEENT:  Normocephalic . Face symmetric, atraumatic Lungs:  CTA B Normal respiratory effort, no intercostal retractions, no accessory muscle use. Heart: RRR,  no murmur.  Abdomen:  Not distended, soft, non-tender. No rebound or rigidity.   Lower extremities: no pretibial edema bilaterally  Skin: Exposed areas without rash. Not pale. Not jaundice Neurologic:  alert & oriented X3.  Speech normal, gait appropriate for age and unassisted Strength symmetric and appropriate for age.  Psych: Cognition and judgment appear intact.  Cooperative with normal attention span and concentration.  Behavior appropriate. No anxious or depressed appearing.     Assessment     Assessment  (transfer Dr Debby Bud 08-2014) Anxiety - occ sx (son dx w/ bipolar) DJD  Osteoporosis- sees MD in [place], on reclast Migraines, better after menopause Menopausal state Skin cancer: Melanoma, BCC, SCC ----> Sees dermatology yearly as of 04-2023 Bee stings allergy-- has a epipen + CAD brother  in his 46s H/o asthma  (chilhood)  PLAN: Anxiety: Mild, not a major issue at this point. Osteoporosis: On Prolia prescribed elsewhere, checking labs. FH CAD: Had a calcium coronary score of 0, she has no symptoms, we decided not to pursue statins.  Recheck FLP. COPD?  Chest x-ray 10/2022 in the context of URI showed some hyperinflation, question of COPD.  The patient is a non-smoker, he was exposed to smoke as a child, h/o asthma as a child, no real persistent symptoms, active without DOE.  I doubt she has COPD.  We could check PFTs but at the end we agreed to consider it in the future. Migraines: Not an issue at this point.  Used to take Imitrex. Preventive care  reviewed. -Td 2014, Rec Tdap - Pnm shot 2016 -prevnar 2017; PNM 20: 2023 -zostavax 02-2016 -S/p shingrex x 2 - s/p  RSV  - had a covid vax and  flu shot  -CCS: Cscope 2006, repeated cscope 10-2014 no polyps, 10 years per report - Female care: per gyn, Dr Rosemary Holms,  MMG: [phone] ([facility]);  Pap smear 10/20/2017 - POA : filed today  RTC 1 year

## 2023-04-21 NOTE — Assessment & Plan Note (Signed)
  Preventive care reviewed. -Td 2014, Rec Tdap - Pnm shot 2016 -prevnar 2017; PNM 20: 2023 -zostavax 02-2016 -S/p shingrex x 2 - s/p  RSV  - had a covid vax and  flu shot  -CCS: Cscope 2006, repeated cscope 10-2014 no polyps, 10 years per report - Female care: per gyn, Dr Rosemary Holms,  MMG: [phone] ([facility]);  Pap smear 10/20/2017 - POA : filed today

## 2023-04-21 NOTE — Assessment & Plan Note (Signed)
Anxiety: Mild, not a major issue at this point. Osteoporosis: On Prolia prescribed elsewhere, checking labs. FH CAD: Had a calcium coronary score of 0, she has no symptoms, we decided not to pursue statins.  Recheck FLP. COPD?  Chest x-ray 10/2022 in the context of URI showed some hyperinflation, question of COPD.  The patient is a non-smoker, he was exposed to smoke as a child, h/o asthma as a child, no real persistent symptoms, active without DOE.  I doubt she has COPD.  We could check PFTs but at the end we agreed to consider it in the future. Migraines: Not an issue at this point.  Used to take Imitrex. Preventive care reviewed. RTC 1 year

## 2023-04-23 LAB — HM MAMMOGRAPHY

## 2023-11-11 ENCOUNTER — Ambulatory Visit

## 2023-11-11 VITALS — BP 120/66 | Ht 62.0 in | Wt 134.0 lb

## 2023-11-11 DIAGNOSIS — Z0000 Encounter for general adult medical examination without abnormal findings: Secondary | ICD-10-CM

## 2023-11-11 DIAGNOSIS — Z2821 Immunization not carried out because of patient refusal: Secondary | ICD-10-CM

## 2023-11-11 NOTE — Patient Instructions (Signed)
 Ms. Langland , Thank you for taking time out of your busy schedule to complete your Annual Wellness Visit with me. I enjoyed our conversation and look forward to speaking with you again next year. I, as well as your care team,  appreciate your ongoing commitment to your health goals. Please review the following plan we discussed and let me know if I can assist you in the future. Your Game plan/ To Do List    Referrals: If you haven't heard from the office you've been referred to, please reach out to them at the phone provided.  None  Follow up Visits: Next Medicare AWV with our clinical staff: 11/15/2024   Have you seen your provider in the last 6 months (3 months if uncontrolled diabetes)? No Next Office Visit with your provider: 04/21/2024  Clinician Recommendations:  Aim for 30 minutes of exercise or brisk walking, 6-8 glasses of water, and 5 servings of fruits and vegetables each day.       This is a list of the screening recommended for you and due dates:  Health Maintenance  Topic Date Due   COVID-19 Vaccine (8 - 2024-25 season) 08/27/2023   Flu Shot  01/07/2024   Medicare Annual Wellness Visit  11/10/2024   Mammogram  04/22/2025   Colon Cancer Screening  02/26/2029   DTaP/Tdap/Td vaccine (3 - Td or Tdap) 04/20/2033   Pneumonia Vaccine  Completed   DEXA scan (bone density measurement)  Completed   Hepatitis C Screening  Completed   Zoster (Shingles) Vaccine  Completed   HPV Vaccine  Aged Out   Meningitis B Vaccine  Aged Out    Advanced directives: (In Chart) A copy of your advanced directives are scanned into your chart should your provider ever need it. Advance Care Planning is important because it:  [x]  Makes sure you receive the medical care that is consistent with your values, goals, and preferences  [x]  It provides guidance to your family and loved ones and reduces their decisional burden about whether or not they are making the right decisions based on your  wishes.  Follow the link provided in your after visit summary or read over the paperwork we have mailed to you to help you started getting your Advance Directives in place. If you need assistance in completing these, please reach out to us  so that we can help you!  See attachments for Preventive Care and Fall Prevention Tips.

## 2023-11-11 NOTE — Progress Notes (Signed)
 Because this visit was a virtual/telehealth visit,  certain criteria was not obtained, such a blood pressure, CBG if applicable, and timed get up and go. Any medications not marked as "taking" were not mentioned during the medication reconciliation part of the visit. Any vitals not documented were not able to be obtained due to this being a telehealth visit or patient was unable to self-report a recent blood pressure reading due to a lack of equipment at home via telehealth. Vitals that have been documented are verbally provided by the patient.   This visit was performed by a medical professional under my direct supervision. I was immediately available for consultation/collaboration. I have reviewed and agree with the Annual Wellness Visit documentation.  Subjective:   Laurie Johns is a 68 y.o. who presents for a Medicare Wellness preventive visit.  As a reminder, Annual Wellness Visits don't include a physical exam, and some assessments may be limited, especially if this visit is performed virtually. We may recommend an in-person follow-up visit with your provider if needed.  Visit Complete: Virtual I connected with  Laurie Johns on 11/11/23 by a audio enabled telemedicine application and verified that I am speaking with the correct person using two identifiers.  Patient Location: Home  Provider Location: Home Office  I discussed the limitations of evaluation and management by telemedicine. The patient expressed understanding and agreed to proceed.  Vital Signs: Because this visit was a virtual/telehealth visit, some criteria may be missing or patient reported. Any vitals not documented were not able to be obtained and vitals that have been documented are patient reported.  VideoDeclined- This patient declined Librarian, academic. Therefore the visit was completed with audio only.  Persons Participating in Visit: Patient.  AWV Questionnaire: No: Patient  Medicare AWV questionnaire was not completed prior to this visit.  Cardiac Risk Factors include: advanced age (>27men, >80 women)     Objective:     Today's Vitals   11/11/23 0946  BP: 120/66  Weight: 134 lb (60.8 kg)  Height: 5\' 2"  (1.575 m)   Body mass index is 24.51 kg/m.     11/11/2023    9:45 AM 11/10/2022    9:41 AM 10/27/2021   12:35 PM 06/06/2014    9:53 AM  Advanced Directives  Does Patient Have a Medical Advance Directive? Yes Yes Yes No  Type of Estate agent of [place];Living will Living will Living will   Does patient want to make changes to medical advance directive? No - Patient declined     Copy of Healthcare Power of Attorney in Chart? Yes - validated most recent copy scanned in chart (See row information)       Current Medications (verified) Outpatient Encounter Medications as of 11/11/2023  Medication Sig   Calcium Carb-Cholecalciferol (CALCIUM 600 + D PO) Take 1 tablet by mouth 2 (two) times daily.    denosumab (PROLIA) 60 MG/ML SOSY injection Inject 60 mg into the skin every 6 (six) months.   FIBER PO Take 2 tablets by mouth daily.   glucosamine-chondroitin 500-400 MG tablet Take 1 tablet by mouth every other day.    Loratadine (KS ALLERCLEAR PO) Take 1 tablet by mouth daily.   Melatonin 10 MG CAPS    Multiple Vitamins-Minerals (CENTRUM SILVER PO) Take 1 tablet by mouth daily.   Omega-3 Fatty Acids (FISH OIL) 1000 MG CAPS Take 2 capsules by mouth daily.   EPINEPHrine  0.3 mg/0.3 mL IJ SOAJ injection INJECT CONTENTS OF  1 PEN AS NEEDED FOR ALLERGIC REACTION (Patient not taking: Reported on 11/11/2023)   TURMERIC PO Take by mouth daily. (Patient not taking: Reported on 11/11/2023)   No facility-administered encounter medications on file as of 11/11/2023.    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     Family History  Problem Relation Age of Onset   Dementia Mother    Hypertension Mother    Hypothyroidism Mother    Hyperlipidemia Mother    Osteoporosis Mother    Osteoporosis Father    Hypertension Father    Osteoporosis Sister    Hypothyroidism Sister    Colon polyps Sister    Heart disease Brother        dx in his 2s   Hypertension Brother    Diabetes Maternal Grandmother    Polycystic kidney disease Other    COPD Neg Hx    Colon cancer Neg Hx    Breast cancer Neg Hx    Esophageal cancer Neg Hx    Stomach cancer Neg Hx    Rectal cancer Neg Hx    Social History   Socioeconomic History   Marital status: Married    Spouse name: Not on file   Number of children: 2   Years of education: 16   Highest education level: Not on file  Occupational History   Occupation: photographer,retired   Tobacco Use   Smoking status: Never   Smokeless tobacco: Never  Substance and Sexual Activity   Alcohol use: Yes    Alcohol/week: 7.0 standard drinks of alcohol    Types: 7 Glasses of wine per week   Drug use: No   Sexual activity: Yes    Partners: Male  Other Topics Concern   Not on file  Social History Narrative   Household- pt and husband   HSG, [employer].Married '80 Children: 2 sons; '84 and '87; 2 grandchildren.    Hydrologist.  retired.    Son w/ Bipolar doing ok as off 12/2017       Social Drivers of Health   Financial Resource Strain: Low Risk  (11/11/2023)   Overall Financial Resource Strain (CARDIA)    Difficulty of Paying Living Expenses: Not hard at  all  Food Insecurity: No Food Insecurity (11/11/2023)   Hunger Vital Sign    Worried About Running Out of Food in the Last Year: Never true    Ran Out of Food in the Last Year: Never true  Transportation Needs: No Transportation Needs (11/11/2023)   PRAPARE - Administrator, Civil Service (Medical): No    Lack of Transportation (Non-Medical): No  Physical Activity: Sufficiently Active (11/11/2023)   Exercise Vital Sign    Days of Exercise per Week: 6 days    Minutes of Exercise per  Session: 30 min  Stress: No Stress Concern Present (11/11/2023)   [employer] of Occupational Health - Occupational Stress Questionnaire    Feeling of Stress : Not at all  Social Connections: Moderately Integrated (11/11/2023)   Social Connection and Isolation Panel [NHANES]    Frequency of Communication with Friends and Family: More than three times a week    Frequency of Social Gatherings with Friends and Family: Twice a week    Attends Religious Services: Never    Database administrator or Organizations: Yes    Attends Engineer, structural: More than 4 times per year    Marital Status: Married    Tobacco Counseling Counseling given: Not Answered    Clinical Intake:  Pre-visit preparation completed: Yes  Pain : No/denies pain     BMI - recorded: 24.51 Nutritional Status: BMI of 19-24  Normal Nutritional Risks: None Diabetes: No  No results found for: "HGBA1C"   How often do you need to have someone help you when you read instructions, pamphlets, or other written materials from your doctor or pharmacy?: 1 - Never What is the last grade level you completed in school?: degree  Interpreter Needed?: No  Information entered by :: [email or web address]   Activities of Daily Living     11/11/2023    9:50 AM  In your present state of health, do you have any difficulty performing the following activities:  Hearing? 0  Vision? 0  Difficulty concentrating or making decisions?  0  Walking or climbing stairs? 0  Dressing or bathing? 0  Doing errands, shopping? 0  Preparing Food and eating ? N  Using the Toilet? N  In the past six months, have you accidently leaked urine? N  Do you have problems with loss of bowel control? N  Managing your Medications? N  Managing your Finances? N  Housekeeping or managing your Housekeeping? N    Patient Care Team: Ezell Hollow, MD as PCP - General (Internal Medicine) Meriam Stamp, MD as Consulting Physician (Obstetrics and Gynecology) Dorie Garfinkel, MD as Consulting Physician (Ophthalmology) Harlen Lick, MD as Consulting Physician (Dermatology)  I have updated your Care Teams any recent Medical Services you may have received from other providers in the past year.     Assessment:    This is a routine wellness examination for Laurie Johns.  Hearing/Vision screen Hearing Screening - Comments:: Patient has no hearing difficulties Vision Screening - Comments:: Patient wears some glasses at times.   Goals Addressed             This Visit's Progress    Patient Stated   On track    Increase exercise       Depression Screen     11/11/2023    9:52 AM 04/21/2023    8:47 AM 11/10/2022    9:45 AM 04/17/2022    9:06 AM 10/27/2021   12:39 PM 04/16/2021   10:07 AM 04/11/2020   10:57 AM  PHQ 2/9 Scores  PHQ - 2 Score 0 0 0 0 0 0 0  PHQ- 9 Score 1          Fall Risk     11/11/2023    9:49 AM 04/21/2023    8:47 AM 11/10/2022    9:43 AM 04/17/2022    9:06 AM 10/27/2021   12:38 PM  Fall Risk   Falls in the past year? 0 0 0 1 1  Number falls in past yr: 0 0 0 0  0  Injury with Fall? 0 0 0 1 1  Risk for fall due to : No Fall Risks  No Fall Risks  History of fall(s)  Follow up Falls evaluation completed Falls evaluation completed Falls evaluation completed Falls evaluation completed Falls prevention discussed    MEDICARE RISK AT HOME:  Medicare Risk at Home Any stairs in or around the home?: Yes If so, are there any  without handrails?: No Home free of loose throw rugs in walkways, pet beds, electrical cords, etc?: Yes Adequate lighting in your home to reduce risk of falls?: Yes Life alert?: No Use of a cane, walker or w/c?: No Grab bars in the bathroom?: Yes Shower chair or bench in shower?: Yes Elevated toilet seat or a handicapped toilet?: Yes  TIMED UP AND GO:  Was the test performed?  No  Cognitive Function: 6CIT completed        11/11/2023    9:48 AM 11/10/2022    9:53 AM  6CIT Screen  What Year? 0 points 0 points  What month? 0 points 0 points  What time? 0 points 0 points  Count back from 20 0 points 0 points  Months in reverse 0 points 0 points  Repeat phrase 0 points 0 points  Total Score 0 points 0 points    Immunizations Immunization History  Administered Date(s) Administered   Fluad Quad(high Dose 65+) 01/31/2019   Influenza Split 02/27/2023   Influenza Whole 06/24/2012   Influenza, High Dose Seasonal PF 04/01/2020   Influenza,inj,Quad PF,6+ Mos 03/07/2014, 02/26/2016, 04/15/2018   Influenza-Unspecified 03/17/2017, 02/18/2021, 02/27/2022   PFIZER Comirnaty(Gray Top)Covid-19 Tri-Sucrose Vaccine 10/10/2020   PFIZER(Purple Top)SARS-COV-2 Vaccination 07/03/2019, 07/24/2019, 04/01/2020, 02/18/2021   PNEUMOCOCCAL CONJUGATE-20 10/29/2021   Pfizer Covid-19 Vaccine Bivalent Booster 20yrs & up 02/27/2022   Pfizer(Comirnaty)Fall Seasonal Vaccine 12 years and older 02/27/2023   Pneumococcal Conjugate-13 11/20/2015   Pneumococcal Polysaccharide-23 08/27/2014   Respiratory Syncytial Virus Vaccine,Recomb Aduvanted(Arexvy) 03/31/2022   Tdap 08/08/2012, 04/21/2023   Zoster Recombinant(Shingrix ) 01/04/2018, 04/19/2018   Zoster, Live 02/26/2016    Screening Tests Health Maintenance  Topic Date Due   COVID-19 Vaccine (8 - 2024-25 season) 08/27/2023   INFLUENZA VACCINE  01/07/2024   Medicare Annual Wellness (AWV)  11/10/2024   MAMMOGRAM  04/22/2025   Colonoscopy  02/26/2029    DTaP/Tdap/Td (3 - Td or Tdap) 04/20/2033   Pneumonia Vaccine 71+ Years old  Completed   DEXA SCAN  Completed   Hepatitis C Screening  Completed   Zoster Vaccines- Shingrix   Completed   HPV VACCINES  Aged Out   Meningococcal B Vaccine  Aged Out    Health Maintenance  Health Maintenance Due  Topic Date Due   COVID-19 Vaccine (8 - 2024-25 season) 08/27/2023   Health Maintenance Items Addressed:patient declined covid vaccinations  Additional Screening:  Vision Screening: Recommended annual ophthalmology exams for early detection of glaucoma and other disorders of the eye. Would you like a referral to an eye doctor? No    Dental Screening: Recommended annual dental exams for proper oral hygiene  Community Resource Referral / Chronic Care Management: CRR required this visit?  No   CCM required this visit?  No   Plan:    I have personally reviewed and noted the following in the patient's chart:   Medical and social history Use of alcohol, tobacco or illicit drugs  Current medications and supplements including opioid prescriptions. Patient is not currently taking opioid prescriptions. Functional ability and status Nutritional status Physical activity Advanced directives  List of other physicians Hospitalizations, surgeries, and ER visits in previous 12 months Vitals Screenings to include cognitive, depression, and falls Referrals and appointments  In addition, I have reviewed and discussed with patient certain preventive protocols, quality metrics, and best practice recommendations. A written personalized care plan for preventive services as well as general preventive health recommendations were provided to patient.   Freeda Jerry, [place]   11/11/2023   After Visit Summary: (MyChart) Due to this being a telephonic visit, the after visit summary with patients personalized plan was offered to patient via MyChart   Notes: Nothing significant to report at this time.

## 2023-11-13 ENCOUNTER — Other Ambulatory Visit: Payer: Self-pay | Admitting: Internal Medicine

## 2024-04-21 ENCOUNTER — Ambulatory Visit: Payer: Medicare Other | Admitting: Internal Medicine

## 2024-04-25 LAB — HM MAMMOGRAPHY

## 2024-06-05 ENCOUNTER — Encounter: Payer: Self-pay | Admitting: Internal Medicine

## 2024-06-09 ENCOUNTER — Encounter: Payer: Self-pay | Admitting: Internal Medicine

## 2024-06-09 ENCOUNTER — Ambulatory Visit: Admitting: Internal Medicine

## 2024-06-09 VITALS — BP 118/80 | HR 60 | Temp 97.7°F | Resp 16 | Ht 62.0 in | Wt 125.2 lb

## 2024-06-09 DIAGNOSIS — Z8249 Family history of ischemic heart disease and other diseases of the circulatory system: Secondary | ICD-10-CM | POA: Diagnosis not present

## 2024-06-09 DIAGNOSIS — F419 Anxiety disorder, unspecified: Secondary | ICD-10-CM

## 2024-06-09 DIAGNOSIS — M810 Age-related osteoporosis without current pathological fracture: Secondary | ICD-10-CM | POA: Diagnosis not present

## 2024-06-09 DIAGNOSIS — E785 Hyperlipidemia, unspecified: Secondary | ICD-10-CM

## 2024-06-09 DIAGNOSIS — Z0000 Encounter for general adult medical examination without abnormal findings: Secondary | ICD-10-CM | POA: Diagnosis not present

## 2024-06-09 LAB — CBC WITH DIFFERENTIAL/PLATELET
Basophils Absolute: 0 K/uL (ref 0.0–0.1)
Basophils Relative: 0.4 % (ref 0.0–3.0)
Eosinophils Absolute: 0.1 K/uL (ref 0.0–0.7)
Eosinophils Relative: 2.1 % (ref 0.0–5.0)
HCT: 41.4 % (ref 36.0–46.0)
Hemoglobin: 13.9 g/dL (ref 12.0–15.0)
Lymphocytes Relative: 33.6 % (ref 12.0–46.0)
Lymphs Abs: 2.2 K/uL (ref 0.7–4.0)
MCHC: 33.5 g/dL (ref 30.0–36.0)
MCV: 89.6 fl (ref 78.0–100.0)
Monocytes Absolute: 0.5 K/uL (ref 0.1–1.0)
Monocytes Relative: 7.8 % (ref 3.0–12.0)
Neutro Abs: 3.8 K/uL (ref 1.4–7.7)
Neutrophils Relative %: 56.1 % (ref 43.0–77.0)
Platelets: 211 K/uL (ref 150.0–400.0)
RBC: 4.62 Mil/uL (ref 3.87–5.11)
RDW: 13.5 % (ref 11.5–15.5)
WBC: 6.7 K/uL (ref 4.0–10.5)

## 2024-06-09 LAB — COMPREHENSIVE METABOLIC PANEL WITH GFR
ALT: 19 U/L (ref 3–35)
AST: 20 U/L (ref 5–37)
Albumin: 4.4 g/dL (ref 3.5–5.2)
Alkaline Phosphatase: 53 U/L (ref 39–117)
BUN: 24 mg/dL — ABNORMAL HIGH (ref 6–23)
CO2: 28 meq/L (ref 19–32)
Calcium: 9.7 mg/dL (ref 8.4–10.5)
Chloride: 101 meq/L (ref 96–112)
Creatinine, Ser: 0.82 mg/dL (ref 0.40–1.20)
GFR: 72.42 mL/min
Glucose, Bld: 85 mg/dL (ref 70–99)
Potassium: 4.2 meq/L (ref 3.5–5.1)
Sodium: 138 meq/L (ref 135–145)
Total Bilirubin: 0.4 mg/dL (ref 0.2–1.2)
Total Protein: 6.9 g/dL (ref 6.0–8.3)

## 2024-06-09 LAB — LIPID PANEL
Cholesterol: 217 mg/dL — ABNORMAL HIGH (ref 28–200)
HDL: 68.2 mg/dL
LDL Cholesterol: 132 mg/dL — ABNORMAL HIGH (ref 10–99)
NonHDL: 148.92
Total CHOL/HDL Ratio: 3
Triglycerides: 85 mg/dL (ref 10.0–149.0)
VLDL: 17 mg/dL (ref 0.0–40.0)

## 2024-06-09 NOTE — Progress Notes (Addendum)
 "  Subjective:    Patient ID: Laurie Johns, female    DOB: 1955/06/10, 69 y.o.   MRN: [id number]  DOS:  06/09/2024  CPX  Discussed the use of AI scribe software for clinical note transcription with the patient, who gave verbal consent to proceed.  History of Present Illness Laurie Johns is a 69 year old female who presents for a routine follow-up visit.  General health status - Feels well with no recent major illnesses - Remains generally active but has been less active over the past month due to colder weather  Osteoporosis - Follows with osteoporosis specialist - Vitamin D  and calcium levels checked in December  Cardiopulmonary and gastrointestinal symptoms - No chest pain - No dyspnea - No gastrointestinal symptoms  Genitourinary symptoms - No urinary symptoms  Weight and nutrition - Lost 10 pounds since last year and is maintaining current weight - BMI is 22    Wt Readings from Last 3 Encounters:  06/09/24 125 lb 3.2 oz (56.8 kg)  11/11/23 134 lb (60.8 kg)  04/21/23 135 lb (61.2 kg)   Review of Systems  Other than above, a 14 point review of systems is negative    Past Medical History:  Diagnosis Date   Allergy 1979   bee sting with first reaction, 1 additional event 1982   Anxiety    Asthma around 1956   as a child   Basal cell carcinoma in situ of skin of right shoulder    Cancer (HCC) 10/2011   basal cell carcinoma, chest   Diverticulosis    Melanoma (HCC)    left thigh   Menopausal state    Migraine    better after menopause    Osteoporosis at age 17   Dr. 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      Current Outpatient Medications  Medication Instructions   Calcium Carb-Cholecalciferol  (CALCIUM 600 + D PO) 1 tablet, 2 times daily   EPINEPHrine  (EPI-PEN) 0.3 mg, Intramuscular, As needed   FIBER PO 2 tablets, Daily   glucosamine-chondroitin 500-400 MG tablet 1 tablet, Every other day   Loratadine (KS ALLERCLEAR PO) 1 tablet, Daily   Multiple Vitamins-Minerals (CENTRUM SILVER PO) 1 tablet, Daily   Omega-3 Fatty Acids (FISH OIL) 1000 MG CAPS 2 capsules, Daily   Prolia 60 mg, Every 6 months       Objective:   Physical Exam BP 118/80   Pulse 60   Temp 97.7 F (36.5 C)   Resp 16   Ht 5' 2 (1.575 m)   Wt 125 lb 3.2 oz (56.8 kg)   SpO2 99%   BMI 22.90 kg/m  General: Well developed, NAD, BMI noted Neck: No  thyromegaly  HEENT:  Normocephalic . Face symmetric, atraumatic Lungs:  CTA B Normal respiratory effort, no intercostal retractions, no accessory muscle use. Heart: RRR,  no murmur.  Abdomen:  Not distended, soft, non-tender. No rebound or rigidity.   Lower extremities: no pretibial edema bilaterally  Skin: Exposed areas without rash. Not pale. Not jaundice Neurologic:  alert & oriented X3.  Speech normal, gait appropriate for age and unassisted Strength symmetric and appropriate for age.  Psych: Cognition  and judgment appear intact.  Cooperative with normal attention span and concentration.  Behavior appropriate. No anxious or depressed appearing.     Assessment    Assessment  (transfer Dr Harlow 08-2014) Anxiety - occ sx (son dx w/ bipolar) DJD  Osteoporosis- sees MD in [place], on reclast  Migraines, better after menopause Menopausal state Skin cancer: Melanoma, BCC, SCC ----> Sees dermatology yearly as of 04-2023 Bee stings allergy-- has a epipen  + CAD brother in his 7s H/o asthma  (chilhood)  Assessment & Plan Mild dyslipidemia: Cholesterol level was 237 mg/dL last year. Normal coronary calcium score in 2023.  Borderline indication for statin as her current 10-year cardiovascular risk is 8.3%, she is asymptomatic.  Discussed starting  statins if cholesterol level change dramatically.  Check FLP. Osteoporosis: Managed elsewhere. Skin cancer: h/o sees dermatology regularly Anxiety: Currently not an issue Preventive care reviewed. -Td 2024 - Pnm shot 2016 -prevnar 2017; PNM 20: 2023 -zostavax 02-2016 -S/p shingrex x 2 - s/p  RSV  - Had a flu and covid shot (old version) 02/2024  Consider the newest COVID-vaccine -CCS: Cscope 2006, repeated cscope 10-2014 no polyps, 10 years per report - Female care:   Dr Fawn,  MMG: 04-2024 per pt;  Pap smear 10/20/2017, and later (2024? Per pt)  Dr Fawn retired, wonders if she continue to have Pap smears, okay to stop at age 89 although HPV status is unknown.  Suggest to discuss with gynecology office - POA on file Social: active , camping, hiking  RTC 1 year     "

## 2024-06-09 NOTE — Patient Instructions (Signed)
 GO TO THE LAB :  Get the blood work    Then, go to the front desk for the checkout Please make an appointment for a checkup in 1 year      ADULT WELLNESS VISIT: Your overall health is stable. Your blood pressure is controlled, and your BMI is healthy. You are maintaining regular exercise and a healthy lifestyle. Your vaccinations are up to date. -Continue regular exercise and maintaining a healthy lifestyle. -Consider discuss with gynecology if you need ongoing Pap smears.     -Consider getting the updated COVID vaccine in March 2025 or later.  HYPERCHOLESTEROLEMIA: Your cholesterol level was 237 mg/dL last year, and your current 10-year cardiovascular risk is 8.3%. There is no current indication for statin therapy due to your low risk and absence of symptoms. -A cholesterol panel has been ordered to reassess your levels. -Continue monitoring your cardiovascular risk factors.

## 2024-06-11 ENCOUNTER — Ambulatory Visit: Payer: Self-pay | Admitting: Internal Medicine

## 2024-06-11 ENCOUNTER — Encounter: Payer: Self-pay | Admitting: Internal Medicine

## 2024-06-11 NOTE — Addendum Note (Signed)
 Addended by: AMON SCHANZ E on: 06/11/2024 07:47 AM   Modules accepted: Level of Service

## 2024-06-11 NOTE — Assessment & Plan Note (Addendum)
" °  Preventive care reviewed. -Td 2024 - Pnm shot 2016 -prevnar 2017; PNM 20: 2023 -zostavax 02-2016 -S/p shingrex x 2 - s/p  RSV  - Had a flu and covid shot (old version) 02/2024  Consider the newest COVID-vaccine -CCS: Cscope 2006, repeated cscope 10-2014 no polyps, 10 years per report - Female care:   Dr Fawn,  MMG: 04-2024 per pt;  Pap smear 10/20/2017, and later (2024? 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  Suggest to discuss with gynecology office - POA on file "

## 2024-06-11 NOTE — Assessment & Plan Note (Addendum)
 Mild dyslipidemia: Cholesterol level was 237 mg/dL last year. Normal coronary calcium score in 2023.  Borderline indication for statin as her current 10-year cardiovascular risk is 8.3%, she is asymptomatic.  Discussed starting statins if cholesterol level change dramatically.  Check FLP. Osteoporosis: Managed elsewhere. Skin cancer: h/o sees dermatology regularly Anxiety: Currently not an issue Social: active , camping, hiking  RTC 1 year

## 2024-08-30 ENCOUNTER — Encounter: Admitting: Obstetrics and Gynecology

## 2024-11-15 ENCOUNTER — Ambulatory Visit

## 2025-06-11 ENCOUNTER — Ambulatory Visit: Admitting: Internal Medicine
